# Patient Record
Sex: Male | Born: 1970 | Race: Black or African American | Hispanic: No | Marital: Married | Smoking: Never smoker
Health system: Southern US, Community
[De-identification: ages and names within clinical notes are randomized; demographics above are authoritative.]

## PROBLEM LIST (undated history)

## (undated) DIAGNOSIS — H409 Unspecified glaucoma: Secondary | ICD-10-CM

## (undated) HISTORY — DX: Unspecified glaucoma: H40.9

---

## 2005-08-07 ENCOUNTER — Emergency Department: Payer: Self-pay | Admitting: Internal Medicine

## 2006-09-22 ENCOUNTER — Ambulatory Visit: Payer: Self-pay | Admitting: Unknown Physician Specialty

## 2006-11-11 ENCOUNTER — Ambulatory Visit: Payer: Self-pay | Admitting: Podiatry

## 2009-05-25 ENCOUNTER — Ambulatory Visit: Payer: Self-pay | Admitting: Family Medicine

## 2010-10-07 ENCOUNTER — Ambulatory Visit: Payer: Self-pay | Admitting: Family Medicine

## 2017-10-21 ENCOUNTER — Ambulatory Visit (INDEPENDENT_AMBULATORY_CARE_PROVIDER_SITE_OTHER): Payer: BLUE CROSS/BLUE SHIELD

## 2017-10-21 ENCOUNTER — Other Ambulatory Visit: Payer: Self-pay | Admitting: Podiatry

## 2017-10-21 ENCOUNTER — Ambulatory Visit (INDEPENDENT_AMBULATORY_CARE_PROVIDER_SITE_OTHER): Payer: BLUE CROSS/BLUE SHIELD | Admitting: Podiatry

## 2017-10-21 DIAGNOSIS — M21611 Bunion of right foot: Secondary | ICD-10-CM

## 2017-10-21 DIAGNOSIS — B353 Tinea pedis: Secondary | ICD-10-CM

## 2017-10-21 DIAGNOSIS — R52 Pain, unspecified: Secondary | ICD-10-CM

## 2017-10-21 DIAGNOSIS — M79676 Pain in unspecified toe(s): Secondary | ICD-10-CM

## 2017-10-21 DIAGNOSIS — M2041 Other hammer toe(s) (acquired), right foot: Secondary | ICD-10-CM

## 2017-10-21 DIAGNOSIS — B351 Tinea unguium: Secondary | ICD-10-CM

## 2017-10-21 DIAGNOSIS — M2042 Other hammer toe(s) (acquired), left foot: Secondary | ICD-10-CM

## 2017-10-21 DIAGNOSIS — Z79899 Other long term (current) drug therapy: Secondary | ICD-10-CM

## 2017-10-21 MED ORDER — TERBINAFINE HCL 250 MG PO TABS: 250.0000 mg | ORAL_TABLET | Freq: Every day | ORAL | 0 refills | Status: DC

## 2017-10-21 MED ORDER — CLOTRIMAZOLE-BETAMETHASONE 1-0.05 % EX CREA: 1.0000 "application " | TOPICAL_CREAM | Freq: Two times a day (BID) | CUTANEOUS | 2 refills | Status: DC

## 2017-10-21 NOTE — Progress Notes (Signed)
   Subjective:    Patient ID: Vincent Wright, male    DOB: Dec 19, 1970, 47 y.o.   MRN: [id number]  HPI    Review of Systems  All other systems reviewed and are negative.      Objective:   Physical Exam        Assessment & Plan:

## 2017-10-22 LAB — HEPATIC FUNCTION PANEL
ALK PHOS: 54 IU/L (ref 39–117)
ALT: 24 IU/L (ref 0–44)
AST: 29 IU/L (ref 0–40)
Albumin: 4.2 g/dL (ref 3.5–5.5)
BILIRUBIN, DIRECT: 0.12 mg/dL (ref 0.00–0.40)
Bilirubin Total: 0.4 mg/dL (ref 0.0–1.2)
Total Protein: 6.7 g/dL (ref 6.0–8.5)

## 2017-10-24 NOTE — Progress Notes (Signed)
   Subjective: 47 year old male presenting today as a new patient with a chief complaint of pain to the bilateral second toes and forefoot area that began about one month ago. He reports associated dry skin to bilateral feet as well as discoloration and thickening of the nails of bilaterally. He has not done anything to treat his symptoms. There are no modifying factors noted. He reports h/o left foot surgery for what he believes to be treatment of bone spurs. Patient is here for further evaluation and treatment.   No past medical history on file.    Objective: Physical Exam General: The patient is alert and oriented x3 in no acute distress.  Dermatology: Pruritus to the bilateral feet with hyperkeratosis. Skin is cool, dry and supple bilateral lower extremities. Negative for open lesions or macerations.  Vascular: Palpable pedal pulses bilaterally. No edema or erythema noted. Capillary refill within normal limits.  Neurological: Epicritic and protective threshold grossly intact bilaterally.   Musculoskeletal Exam: Clinical evidence of bunion deformity noted to the respective foot. There is a moderate pain on palpation range of motion of the first MPJ. Lateral deviation of the hallux noted consistent with hallux abductovalgus. Hammertoe contracture also noted on clinical exam to digits 2-4 of the right foot. Symptomatic pain on palpation and range of motion also noted to the metatarsal phalangeal joints of the respective hammertoe digits.   Clinical evidence of Tailor's bunion deformity noted to the respective foot. There is a moderate pain on palpation range of motion of the fifth MPJ.   Radiographic Exam: Increased intermetatarsal angle greater than 15 with a hallux abductus angle greater than 30 noted on AP view. Moderate degenerative changes noted within the first MPJ. Contracture deformity also noted to the interphalangeal joints and MPJs of the digits of the respective  hammertoes.    Assessment: 1. HAV w/ bunion deformity right foot 2. 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 Tinea pedis bilateral  4. Tailor's bunion deformity right    Plan of Care:  1. Patient was evaluated. X-Rays reviewed. 2. Discussed surgery regarding HAV with bunion deformity of the right foot, Tailor's bunion deformity of the right foot and hammertoe repair of digits 2-4 of the right foot. Patient will require 6-8 weeks off from work. He will make arrangements with work and return to the office for surgical consult.  3. Prescription for Lamisil 250 mg #90 provided to patient.  4. Orders for liver function test placed. 5. Return to clinic as needed.   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    Felecia ShellingBrent M. Jesua Tamblyn, DPM [facility]  Dr. Felecia ShellingBrent M. Annielee Jemmott, DPM    [address]                                        [place], [place] [address]                Office [phone]  Fax [phone](336) [phone]

## 2017-12-23 ENCOUNTER — Encounter: Payer: Self-pay | Admitting: Podiatry

## 2017-12-23 ENCOUNTER — Telehealth: Payer: Self-pay | Admitting: *Deleted

## 2017-12-23 ENCOUNTER — Ambulatory Visit (INDEPENDENT_AMBULATORY_CARE_PROVIDER_SITE_OTHER): Payer: BLUE CROSS/BLUE SHIELD | Admitting: Podiatry

## 2017-12-23 DIAGNOSIS — M21611 Bunion of right foot: Secondary | ICD-10-CM | POA: Diagnosis not present

## 2017-12-23 DIAGNOSIS — M2041 Other hammer toe(s) (acquired), right foot: Secondary | ICD-10-CM

## 2017-12-23 NOTE — Patient Instructions (Signed)
Pre-Operative Instructions  Congratulations, you have decided to take an important step towards improving your quality of life.  You can be assured that the doctors and staff at [facility] will be with you every step of the way.  Here are some important things you should know:  1. Plan to be at the surgery center/hospital at least 1 (one) hour prior to your scheduled time, unless otherwise directed by the surgical center/hospital staff.  You must have a responsible adult accompany you, remain during the surgery and drive you home.  Make sure you have directions to the surgical center/hospital to ensure you arrive on time. 2. If you are having surgery at [facility] or [facility], you will need a copy of your medical history and physical form from your family physician within one month prior to the date of surgery. We will give you a form for your primary physician to complete.  3. We make every effort to accommodate the date you request for surgery.  However, there are times where surgery dates or times have to be moved.  We will contact you as soon as possible if a change in schedule is required.   4. No aspirin/ibuprofen for one week before surgery.  If you are on aspirin, any non-steroidal anti-inflammatory medications (Mobic, Aleve, Ibuprofen) should not be taken seven (7) days prior to your surgery.  You make take Tylenol for pain prior to surgery.  5. Medications - If you are taking daily heart and blood pressure medications, seizure, reflux, allergy, asthma, anxiety, pain or diabetes medications, make sure you notify the surgery center/hospital before the day of surgery so they can tell you which medications you should take or avoid the day of surgery. 6. No food or drink after midnight the night before surgery unless directed otherwise by surgical center/hospital staff. 7. No alcoholic beverages 24-hours prior to surgery.  No smoking 24-hours prior or 24-hours after  surgery. 8. Wear loose pants or shorts. They should be loose enough to fit over bandages, boots, and casts. 9. Don't wear slip-on shoes. Sneakers are preferred. 10. Bring your boot with you to the surgery center/hospital.  Also bring crutches or a walker if your physician has prescribed it for you.  If you do not have this equipment, it will be provided for you after surgery. 11. If you have not been contacted by the surgery center/hospital by the day before your surgery, call to confirm the date and time of your surgery. 12. Leave-time from work may vary depending on the type of surgery you have.  Appropriate arrangements should be made prior to surgery with your employer. 13. Prescriptions will be provided immediately following surgery by your doctor.  Fill these as soon as possible after surgery and take the medication as directed. Pain medications will not be refilled on weekends and must be approved by the doctor. 14. Remove nail polish on the operative foot and avoid getting pedicures prior to surgery. 15. Wash the night before surgery.  The night before surgery wash the foot and leg well with water and the antibacterial soap provided. Be sure to pay special attention to beneath the toenails and in between the toes.  Wash for at least three (3) minutes. Rinse thoroughly with water and dry well with a towel.  Perform this wash unless told not to do so by your physician.  Enclosed: 1 Ice pack (please put in freezer the night before surgery)   1 Hibiclens skin cleaner     Pre-op instructions  If you have any questions regarding the instructions, please do not hesitate to call our office.  [place]: [address], [place], [place] [address] -- [phone]  [place]: [address] [place], [place] [address] -- [phone]  [place]: [address]  [place], [place] [address] -- [phone]  [place]: [address], [address], [place], [place] [address] -- [phone]  Website:  [email or web address] 

## 2017-12-23 NOTE — Telephone Encounter (Addendum)
"  I need to schedule my surgery for my foot.  He said he's going to put some pins in my toes to straighten them out."  Do you have a date in mind that you would like to have it done?  "I'd like to do it as soon as possible."  He can do it on May 2nd.  "Okay, that's fine."  I'll get it scheduled.  You can register with the surgical center now, instructions are in the brochure that was given to you.  When you register please let them know how you would like to receive communication; a text, an email or a call.  "Okay, thank you."

## 2017-12-26 NOTE — Progress Notes (Signed)
   Subjective: 47 year old male presenting today for follow up evaluation of right foot pain. He reports continued pain and is ready to discuss surgery. Patient is here for further evaluation and treatment.   No past medical history on file.    Objective: Physical Exam General: The patient is alert and oriented x3 in no acute distress.  Dermatology: Pruritus to the bilateral feet with hyperkeratosis. Skin is cool, dry and supple bilateral lower extremities. Negative for open lesions or macerations.  Vascular: Palpable pedal pulses bilaterally. No edema or erythema noted. Capillary refill within normal limits.  Neurological: Epicritic and protective threshold grossly intact bilaterally.   Musculoskeletal Exam: Clinical evidence of bunion deformity noted to the respective foot. There is a moderate pain on palpation range of motion of the first MPJ. Lateral deviation of the hallux noted consistent with hallux abductovalgus. Hammertoe contracture also noted on clinical exam to digits 2-4 of the right foot. Symptomatic pain on palpation and range of motion also noted to the metatarsal phalangeal joints of the respective hammertoe digits.   Clinical evidence of Tailor's bunion deformity noted to the respective foot. There is a moderate pain on palpation range of motion of the fifth MPJ.    Assessment: 1. HAV w/ bunion deformity right foot 2. 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 Tinea pedis bilateral  4. Tailor's bunion deformity right    Plan of Care:  1. Patient was evaluated.  2. Today we discussed the conservative versus surgical management of the presenting pathology. The patient opts for surgical management. All possible complications and details of the procedure were explained. All patient questions were answered. No guarantees were expressed or implied. 3. Authorization for surgery was initiated today. Surgery will consist of bunionectomy with double osteotomy right, Tailor's  bunionectomy right, PIPJ arthroplasty with MPJ capsulotomy right 2-4.  4. CAM boot dispensed.  5. Continue taking oral Lamisil 250 mg #90. 6. Return to clinic one week post op.   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    Felecia ShellingBrent M. Dolly Harbach, DPM [facility]  Dr. Felecia ShellingBrent M. Filomena Pokorney, DPM    [address]                                        [place], [place] [address]                Office [phone]([phone]  Fax [phone](336) [phone]

## 2018-01-04 DIAGNOSIS — M722 Plantar fascial fibromatosis: Secondary | ICD-10-CM

## 2018-01-12 ENCOUNTER — Encounter: Payer: Self-pay | Admitting: Podiatry

## 2018-01-12 DIAGNOSIS — M7751 Other enthesopathy of right foot: Secondary | ICD-10-CM | POA: Diagnosis not present

## 2018-01-12 DIAGNOSIS — M21541 Acquired clubfoot, right foot: Secondary | ICD-10-CM | POA: Diagnosis not present

## 2018-01-12 DIAGNOSIS — M2011 Hallux valgus (acquired), right foot: Secondary | ICD-10-CM | POA: Diagnosis not present

## 2018-01-12 DIAGNOSIS — M2041 Other hammer toe(s) (acquired), right foot: Secondary | ICD-10-CM | POA: Diagnosis not present

## 2018-01-14 ENCOUNTER — Telehealth: Payer: Self-pay | Admitting: Podiatry

## 2018-01-14 NOTE — Telephone Encounter (Signed)
This  patient calls the office stating he is a patient of Dr. Logan Bores.  Dr. Logan Bores performed multiple procedures on this patient in surgery.  He says the medicine he is taking for pain is not helping.  He says he told Dr. Logan Bores that oxycodone is at ineffective for him.   He says it did not work in his surgery on his shoulder.  E denies any history of chills or fever.  I told him to take acetaminophen and Advil simultaneously.  I also told him to move his Cam Walker, but to ave the surgical bandage on his foot.  If this pain persists he should check with the ER or call the office Monday morning for postop evaluation

## 2018-01-15 NOTE — Telephone Encounter (Signed)
See above

## 2018-01-20 ENCOUNTER — Ambulatory Visit (INDEPENDENT_AMBULATORY_CARE_PROVIDER_SITE_OTHER): Payer: BLUE CROSS/BLUE SHIELD

## 2018-01-20 ENCOUNTER — Ambulatory Visit (INDEPENDENT_AMBULATORY_CARE_PROVIDER_SITE_OTHER): Payer: BLUE CROSS/BLUE SHIELD | Admitting: Podiatry

## 2018-01-20 ENCOUNTER — Other Ambulatory Visit: Payer: Self-pay

## 2018-01-20 VITALS — BP 162/96 | Temp 98.9°F

## 2018-01-20 DIAGNOSIS — M21619 Bunion of unspecified foot: Secondary | ICD-10-CM

## 2018-01-20 DIAGNOSIS — Z9889 Other specified postprocedural states: Secondary | ICD-10-CM

## 2018-01-20 DIAGNOSIS — M2041 Other hammer toe(s) (acquired), right foot: Secondary | ICD-10-CM

## 2018-01-20 MED ORDER — OXYCODONE-ACETAMINOPHEN 5-325 MG PO TABS: 1.0000 | ORAL_TABLET | ORAL | 0 refills | Status: DC | PRN

## 2018-01-23 NOTE — Progress Notes (Signed)
   Subjective:  Patient presents today status post right forefoot reconstruction surgery. DOS: 01/12/18. He states the boot is uncomfortable to wear at night. He reports some drainage from the medial incision site. There are no modifying factors noted. Patient is here for further evaluation and treatment.    No past medical history on file.    Objective/Physical Exam Neurovascular status intact.  Skin incisions appear to be well coapted with sutures and staples intact. No sign of infectious process noted. No dehiscence. No active bleeding noted. Moderate edema noted to the surgical extremity.  Radiographic Exam:  Orthopedic hardware and osteotomies sites appear to be stable with routine healing.  Assessment: 1. s/p right forefoot reconstruction surgery. DOS: 01/12/18   Plan of Care:  1. Patient was evaluated. X-rays reviewed 2. Dressing changed. Keep clean, dry and intact for one week.  3. Continue weightbearing in CAM boot.  4. Return to clinic in one week.    Felecia Shelling, DPM [facility]  Dr. Felecia Shelling, DPM    [address]                                        [place], [place] [address]                Office [phone]  Fax [phone]

## 2018-01-27 ENCOUNTER — Ambulatory Visit (INDEPENDENT_AMBULATORY_CARE_PROVIDER_SITE_OTHER): Payer: BLUE CROSS/BLUE SHIELD | Admitting: Podiatry

## 2018-01-27 ENCOUNTER — Encounter: Payer: Self-pay | Admitting: Podiatry

## 2018-01-27 ENCOUNTER — Encounter: Payer: BLUE CROSS/BLUE SHIELD | Admitting: Podiatry

## 2018-01-27 DIAGNOSIS — M2041 Other hammer toe(s) (acquired), right foot: Secondary | ICD-10-CM

## 2018-01-27 DIAGNOSIS — Z9889 Other specified postprocedural states: Secondary | ICD-10-CM

## 2018-01-27 DIAGNOSIS — M21619 Bunion of unspecified foot: Secondary | ICD-10-CM

## 2018-01-27 MED ORDER — SULFAMETHOXAZOLE-TRIMETHOPRIM 800-160 MG PO TABS: 1.0000 | ORAL_TABLET | Freq: Two times a day (BID) | ORAL | 0 refills | Status: DC

## 2018-01-27 NOTE — Progress Notes (Signed)
   Subjective:  Patient presents today status post right forefoot reconstruction surgery. DOS: 01/12/18. He states his pain has improved although he is still experiencing some aching. There are no modifying factors. He has been wearing the CAM boot as directed. Patient is here for further evaluation and treatment.    No past medical history on file.    Objective/Physical Exam Neurovascular status intact. Skin incisions appear to be well coapted with sutures and staples intact. No sign of infectious process noted. No dehiscence. No active bleeding noted. Serous drainage noted to medial incision site. Moderate edema noted to the surgical extremity.   Assessment: 1. s/p right forefoot reconstruction surgery. DOS: 01/12/18   Plan of Care:  1. Patient was evaluated.  2. Partial staples/sutures removed.  3. Dressing changed. Keep clean, dry and intact for one week.  4. Continue weightbearing in CAM boot.  5. Prescription for Bactrim DS provided to patient.  6. Return to clinic in one week.    Felecia Shelling, DPM [facility]  Dr. Felecia Shelling, DPM    [address]                                        [place], [place] [address]                Office [phone]  Fax [phone]

## 2018-02-03 ENCOUNTER — Encounter: Payer: Self-pay | Admitting: Podiatry

## 2018-02-03 ENCOUNTER — Ambulatory Visit (INDEPENDENT_AMBULATORY_CARE_PROVIDER_SITE_OTHER): Payer: BLUE CROSS/BLUE SHIELD | Admitting: Podiatry

## 2018-02-03 ENCOUNTER — Ambulatory Visit (INDEPENDENT_AMBULATORY_CARE_PROVIDER_SITE_OTHER): Payer: BLUE CROSS/BLUE SHIELD

## 2018-02-03 DIAGNOSIS — M2041 Other hammer toe(s) (acquired), right foot: Secondary | ICD-10-CM

## 2018-02-03 DIAGNOSIS — Z9889 Other specified postprocedural states: Secondary | ICD-10-CM | POA: Diagnosis not present

## 2018-02-03 DIAGNOSIS — L03031 Cellulitis of right toe: Secondary | ICD-10-CM

## 2018-02-03 MED ORDER — SULFAMETHOXAZOLE-TRIMETHOPRIM 800-160 MG PO TABS: 1.0000 | ORAL_TABLET | Freq: Two times a day (BID) | ORAL | 0 refills | Status: DC

## 2018-02-06 LAB — WOUND CULTURE

## 2018-02-10 ENCOUNTER — Encounter: Payer: Self-pay | Admitting: Podiatry

## 2018-02-10 ENCOUNTER — Ambulatory Visit: Payer: BLUE CROSS/BLUE SHIELD

## 2018-02-10 ENCOUNTER — Ambulatory Visit (INDEPENDENT_AMBULATORY_CARE_PROVIDER_SITE_OTHER): Payer: BLUE CROSS/BLUE SHIELD | Admitting: Podiatry

## 2018-02-10 DIAGNOSIS — M2041 Other hammer toe(s) (acquired), right foot: Secondary | ICD-10-CM

## 2018-02-10 DIAGNOSIS — Z9889 Other specified postprocedural states: Secondary | ICD-10-CM

## 2018-02-10 MED ORDER — SULFAMETHOXAZOLE-TRIMETHOPRIM 800-160 MG PO TABS: 1.0000 | ORAL_TABLET | Freq: Two times a day (BID) | ORAL | 0 refills | Status: DC

## 2018-02-13 NOTE — Progress Notes (Signed)
   Subjective:  Patient presents today status post right forefoot reconstruction surgery. DOS: 01/12/18. He states he is doing better. He reports some intermittent sharp, shooting pain at night. There are no modifying factors noted. Patient is here for further evaluation and treatment.   No past medical history on file.    Objective/Physical Exam Neurovascular status intact. Skin incisions appear to be well coapted. No sign of infectious process noted. No dehiscence. No active bleeding noted. Serous drainage noted to medial incision site. Moderate edema noted to the surgical extremity.   Assessment: 1. s/p right forefoot reconstruction surgery. DOS: 01/12/18   Plan of Care:  1. Patient was evaluated.  2. Cultures reviewed.  3. Remaining staples removed.  4. Percutaneous pins removed.  5. Refill prescription for Bactrim DS provided to patient.  6. Post op shoe dispensed.  7. Return to clinic in 2 weeks.     Felecia ShellingBrent M. Cezar Misiaszek, DPM [facility]  Dr. Felecia ShellingBrent M. Phillipe Clemon, DPM    [address]                                        [place], [place] [address]                Office [phone]([phone]  Fax [phone]

## 2018-02-16 NOTE — Progress Notes (Signed)
   Subjective:  Patient presents today status post right forefoot reconstruction surgery. DOS: 01/12/18. He states his pain has improved although he is still experiencing some aching.  There also is some drainage noted to the medial incision site proximally.  He has been weightbearing in the immobilization cam boot and he completed a round of Bactrim DS.  No past medical history on file.    Objective/Physical Exam Neurovascular status intact. Skin incisions appear to be well coapted with the exception of a small area of dehiscence focal the proximal incision site medial foot with some serous drainage noted.  No malodor.  No significant erythema and edema to the soft tissue structures of the foot.  There is some peri-incisional maceration secondary to the drainage. No active bleeding noted. Serous drainage noted to medial incision site. Moderate edema noted to the surgical extremity.   Assessment: 1. s/p right forefoot reconstruction surgery. DOS: 01/12/18   Plan of Care:  1. Patient was evaluated.  2.  Today we are going to refill the patient's Bactrim DS 3.  Cultures were taken of the area of drainage and sent to pathology for culture and sensitivity 4.  Continue weightbearing in the immobilization cam boot 5.  Return to clinic in 1 week   Felecia ShellingBrent M. Christiann Hagerty, DPM [facility]  Dr. Felecia ShellingBrent M. Anyiah Coverdale, DPM    [address]                                        [place], [place] [address]                Office [phone](336) [phone]  Fax [phone](336) [phone]

## 2018-02-28 ENCOUNTER — Telehealth: Payer: Self-pay | Admitting: Podiatry

## 2018-02-28 NOTE — Telephone Encounter (Signed)
I had surgery on 02 May and my foot is really swollen with a lot of pain. I do not have any pain medication or anything for the past week. I was just calling to see what I should do. My number is [phone]. Thank you.

## 2018-02-28 NOTE — Telephone Encounter (Signed)
Unable to leave a message for pt, voicemail box is not set up. Message routed to A. Glynda JaegerVenable, LPN to call pt tomorrow, set up an appt and to discuss the medication with Dr. Logan BoresEvans.

## 2018-03-01 NOTE — Telephone Encounter (Signed)
Called patient and unable to leave message because voice mail has not been set up yet.

## 2018-03-07 ENCOUNTER — Ambulatory Visit (INDEPENDENT_AMBULATORY_CARE_PROVIDER_SITE_OTHER): Payer: BLUE CROSS/BLUE SHIELD

## 2018-03-07 ENCOUNTER — Encounter: Payer: Self-pay | Admitting: Podiatry

## 2018-03-07 ENCOUNTER — Ambulatory Visit (INDEPENDENT_AMBULATORY_CARE_PROVIDER_SITE_OTHER): Payer: BLUE CROSS/BLUE SHIELD | Admitting: Podiatry

## 2018-03-07 DIAGNOSIS — Z9889 Other specified postprocedural states: Secondary | ICD-10-CM

## 2018-03-07 DIAGNOSIS — M2041 Other hammer toe(s) (acquired), right foot: Secondary | ICD-10-CM | POA: Diagnosis not present

## 2018-03-07 DIAGNOSIS — M79604 Pain in right leg: Secondary | ICD-10-CM

## 2018-03-10 ENCOUNTER — Ambulatory Visit
Admission: RE | Admit: 2018-03-10 | Discharge: 2018-03-10 | Disposition: A | Discharge status: Home / Self Care | Payer: BLUE CROSS/BLUE SHIELD | Source: Ambulatory Visit | Attending: Podiatry | Admitting: Podiatry

## 2018-03-10 DIAGNOSIS — Z9889 Other specified postprocedural states: Secondary | ICD-10-CM | POA: Insufficient documentation

## 2018-03-10 DIAGNOSIS — M79604 Pain in right leg: Secondary | ICD-10-CM | POA: Diagnosis not present

## 2018-03-12 NOTE — Progress Notes (Signed)
   Subjective:  Patient presents today status post right forefoot reconstruction surgery. DOS: 01/12/18.  He states that he continues to have pain along the posterior calf of the right lower extremity.  He is also having a lot of pain and swelling in his toes.  He continues to have pain during ambulation as well.  Currently the pain is 7/10.  He is requesting a refill on his pain medication.   No past medical history on file.    Objective/Physical Exam Neurovascular status intact. Skin incisions appear to be well coapted. No sign of infectious process noted. No dehiscence. No active bleeding noted. Serous drainage noted to medial incision site. Moderate edema noted to the surgical extremity.  There is some pain on palpation to the posterior calf possibly consistent with a DVT.   Assessment: 1. s/p right forefoot reconstruction surgery. DOS: 01/12/18 2.  Possible DVT right lower extremity   Plan of Care:  1. Patient was evaluated.  2.  Order for physical therapy initiated 3.  Order for venous Doppler stat to rule out DVT 4.  Discontinue surgical postoperative shoe.  Resume normal good shoe gear.  Weightbearing and ambulation as tolerated 5.  Return to clinic in 3 weeks.   Felecia ShellingBrent M. Alexey Wright, DPM [facility]  Dr. Felecia ShellingBrent M. Jelena Wright, DPM    [address]                                        [place], [place] [address]                Office [phone]) [phone]  Fax [phone](336) [phone]

## 2018-03-28 ENCOUNTER — Encounter: Payer: BLUE CROSS/BLUE SHIELD | Admitting: Podiatry

## 2018-03-30 ENCOUNTER — Ambulatory Visit (INDEPENDENT_AMBULATORY_CARE_PROVIDER_SITE_OTHER): Payer: BLUE CROSS/BLUE SHIELD

## 2018-03-30 ENCOUNTER — Encounter: Payer: Self-pay | Admitting: Podiatry

## 2018-03-30 ENCOUNTER — Ambulatory Visit (INDEPENDENT_AMBULATORY_CARE_PROVIDER_SITE_OTHER): Payer: BLUE CROSS/BLUE SHIELD | Admitting: Podiatry

## 2018-03-30 DIAGNOSIS — M2041 Other hammer toe(s) (acquired), right foot: Secondary | ICD-10-CM

## 2018-03-30 NOTE — Progress Notes (Signed)
Subjective:   Patient ID: Vincent Wright, male   DOB: 47 y.o.   MRN: [id number]   HPI Patient states doing well but he still has trouble with swelling and putting on his regular shoes.  He is approximately 10 weeks after having multiple foot surgery by Dr. Logan BoresEvans   ROS      Objective:  Physical Exam  Neurovascular status intact with patient's right foot healing well wound edges well coapted hallux in rectus position with some limitation of motion of the big toe joint right.  Patient has good position of the lesser digits     Assessment:  Overall doing well with swelling still occurring which is fairly normal for with this extensive surgery     Plan:  H&P condition reviewed and recommended range of motion exercises for the first MPJ gradual return to shoes with hopeful return to work in the next 4 to 6 weeks.  Reappoint for Dr. Logan BoresEvans to reevaluate the next 4 weeks  X-rays indicate fixation is in place with good alignment noted joint congruence

## 2018-04-09 NOTE — Progress Notes (Signed)
This encounter was created in error - please disregard.

## 2018-04-25 ENCOUNTER — Ambulatory Visit (INDEPENDENT_AMBULATORY_CARE_PROVIDER_SITE_OTHER): Payer: BLUE CROSS/BLUE SHIELD | Admitting: Podiatry

## 2018-04-25 ENCOUNTER — Encounter: Payer: Self-pay | Admitting: Podiatry

## 2018-04-25 ENCOUNTER — Ambulatory Visit (INDEPENDENT_AMBULATORY_CARE_PROVIDER_SITE_OTHER): Payer: BLUE CROSS/BLUE SHIELD

## 2018-04-25 DIAGNOSIS — M7751 Other enthesopathy of right foot: Secondary | ICD-10-CM

## 2018-04-25 DIAGNOSIS — M779 Enthesopathy, unspecified: Secondary | ICD-10-CM

## 2018-04-25 DIAGNOSIS — M2041 Other hammer toe(s) (acquired), right foot: Secondary | ICD-10-CM

## 2018-04-25 DIAGNOSIS — Z9889 Other specified postprocedural states: Secondary | ICD-10-CM

## 2018-04-25 MED ORDER — METHYLPREDNISOLONE 4 MG PO TBPK: ORAL_TABLET | ORAL | 0 refills | Status: DC

## 2018-04-25 MED ORDER — MELOXICAM 15 MG PO TABS: 15.0000 mg | ORAL_TABLET | Freq: Every day | ORAL | 1 refills | Status: DC

## 2018-04-27 NOTE — Progress Notes (Signed)
   Subjective:  Patient presents today status post right forefoot reconstruction surgery. DOS: 01/12/18. He states he got an injection in the lateral right leg three months ago and has had a tender nodule since. Touching the area increases the pain. He has not done anything for treatment. Patient is here for further evaluation and treatment.   History reviewed. No pertinent past medical history.    Objective/Physical Exam Neurovascular status intact. Skin incisions appear to be well coapted. No sign of infectious process noted. No dehiscence. No active bleeding noted. Serous drainage noted to medial incision site. Moderate edema noted to the surgical extremity. Swelling and pain with palpation to the lateral right leg, distally, possibly secondary to CAM boot.   Radiographic Exam:  Normal osseous mineralization. Joint spaces preserved. No fracture/dislocation/boney destruction.    Assessment: 1. s/p right forefoot reconstruction surgery. DOS: 01/12/18 2. Right lateral leg contusion    Plan of Care:  1. Patient was evaluated.  2. Continue daily stretching and range of motion exercises.  3. Recommended good shoe gear.  4. Prescription for Medrol Dose Pak provided to patient.  5. Refill prescription for Meloxicam provided to patient.  6. Return to clinic as needed.    Felecia ShellingBrent M. Evans, DPM [facility]  Dr. Felecia ShellingBrent M. Evans, DPM    [address]                                        [place], [place] [address]                Office [phone](336) [phone]  Fax [phone](336) [phone]

## 2018-05-23 ENCOUNTER — Ambulatory Visit (INDEPENDENT_AMBULATORY_CARE_PROVIDER_SITE_OTHER): Payer: BLUE CROSS/BLUE SHIELD

## 2018-05-23 ENCOUNTER — Ambulatory Visit (INDEPENDENT_AMBULATORY_CARE_PROVIDER_SITE_OTHER): Payer: BLUE CROSS/BLUE SHIELD | Admitting: Podiatry

## 2018-05-23 ENCOUNTER — Encounter: Payer: Self-pay | Admitting: Podiatry

## 2018-05-23 DIAGNOSIS — Z9889 Other specified postprocedural states: Secondary | ICD-10-CM | POA: Diagnosis not present

## 2018-05-23 DIAGNOSIS — M2041 Other hammer toe(s) (acquired), right foot: Secondary | ICD-10-CM

## 2018-05-30 NOTE — Progress Notes (Signed)
   Subjective:  Patient presents today status post right forefoot reconstructive surgery.  Date of surgery 01/12/2018.  Patient states that he continues to feel pain 4/10 to his surgical forefoot.  He also complains of some numbness and also pain specifically when taking his shoes off.  Patient has no new complaints today.   History reviewed. No pertinent past medical history.    Objective/Physical Exam Neurovascular status intact. Skin incisions appear to be healed. No sign of infectious process noted. Negative for edema with pedal pulses palpable and capillary refill immediate. No open lesions. There is some limited range of motion noted to the metatarsophalangeal joints of the surgical forefoot likely due to scar tissue.  There is also some pain on range of motion as well as palpation to the MTPJ's 1 through 5 right foot.  Radiographic Exam:  Normal osseous mineralization. Joint spaces preserved. No fracture/dislocation/boney destruction.  Complete healing of the osteotomy sites  Assessment: 1. s/p right forefoot reconstruction surgery. DOS: 01/12/18  Plan of Care:  1. Patient was evaluated today.  X-rays were reviewed today 2.  Patient continues to have some residual foot pain and joint capsulitis secondary to postoperative recovery. 3.  Today we are going to prescribe Medrol Dosepak 4.  Extend physical therapy 3 times per week x4 weeks.  Patient states that he is getting significant benefit from physical therapy and notices improvement. 5.  Today we are going to extend the patient's disability to 06/22/2018.  After that the patient should be able to return to work full duty no restrictions. 6.  Return to clinic in 1 month  Felecia ShellingBrent M. Arham Symmonds, DPM [facility]  Dr. Felecia ShellingBrent M. Ka Flammer, DPM    [address]                                        [place], [place] [address]                Office [phone](336) [phone]  Fax [phone](336) [phone]

## 2018-06-20 ENCOUNTER — Ambulatory Visit (INDEPENDENT_AMBULATORY_CARE_PROVIDER_SITE_OTHER): Payer: BLUE CROSS/BLUE SHIELD | Admitting: Podiatry

## 2018-06-20 ENCOUNTER — Encounter: Payer: Self-pay | Admitting: Podiatry

## 2018-06-20 DIAGNOSIS — M2041 Other hammer toe(s) (acquired), right foot: Secondary | ICD-10-CM

## 2018-06-20 DIAGNOSIS — M21611 Bunion of right foot: Secondary | ICD-10-CM | POA: Diagnosis not present

## 2018-06-20 NOTE — Progress Notes (Signed)
   Subjective:  Patient presents today status post right forefoot reconstructive surgery.  Date of surgery 01/12/2018.  Patient continues to have some numbness to the plantar aspect of the surgical forefoot.  Denies any significant pain.  He presents for follow-up treatment evaluation   History reviewed. No pertinent past medical history.    Objective/Physical Exam Neurovascular status intact. Skin incisions appear to be healed. No sign of infectious process noted. Negative for edema with pedal pulses palpable and capillary refill immediate. No open lesions. There is some limited range of motion noted to the metatarsophalangeal joints of the surgical forefoot likely due to scar tissue.  Negative for any significant pain on palpation.  Assessment: 1. s/p right forefoot reconstruction surgery. DOS: 01/12/18  Plan of Care:  1. Patient was evaluated today.   2.  Patient is completed physical therapy.  Patient is ready to return to work.  Return to work full duty no restrictions beginning 06/26/2018. 3.  Recommend good supportive shoe gear  Felecia Shelling, DPM [facility]  Dr. Felecia Shelling, DPM    [address]                                        [place], [place] [address]                Office [phone]  Fax [phone]

## 2019-02-23 IMAGING — US US EXTREM LOW VENOUS*R*
1 series · 14 of 24 positions shown · non-contrast
Comparison: None

CLINICAL DATA: Pain x1 month.  Recent foot surgery.

EXAM:
RIGHT LOWER EXTREMITY VENOUS DOPPLER ULTRASOUND
TECHNIQUE: Gray-scale sonography with compression, as well as color and duplex
ultrasound, were performed to evaluate the deep venous system from
the level of the common femoral vein through the popliteal and
proximal calf veins.

[Series 1: us extrem low venous*right* · 0.07mm/px · 14 of 44 slices shown]
[im 1/44]
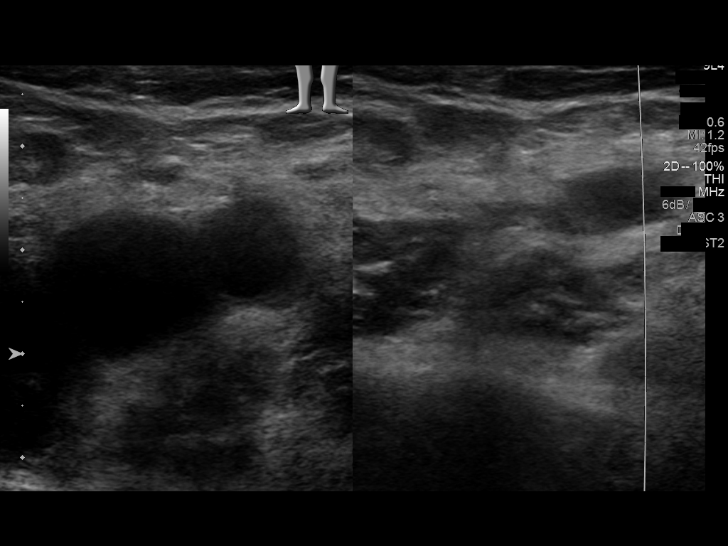
[im 4/44]
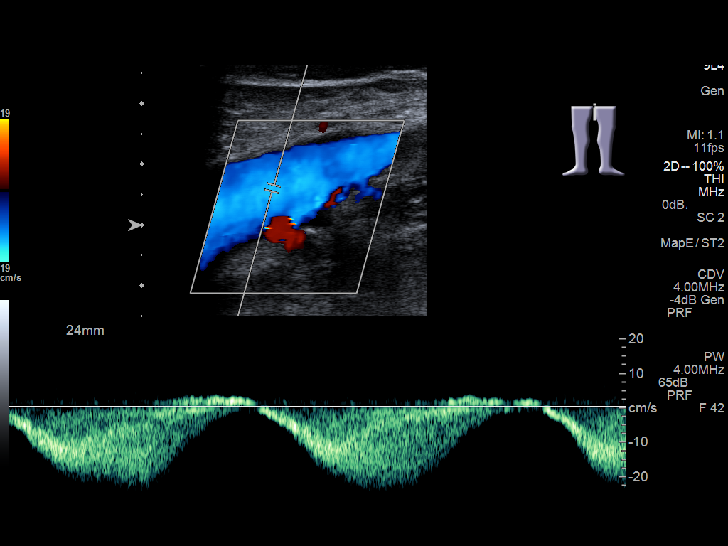
[im 8/44]
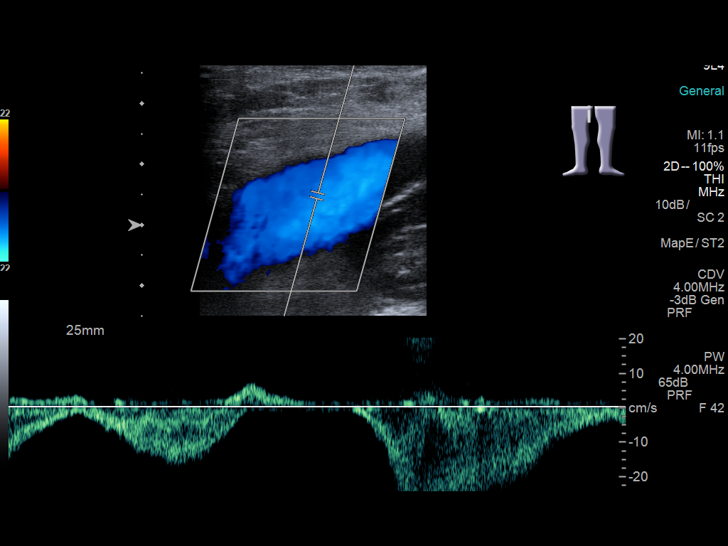
[im 12/44]
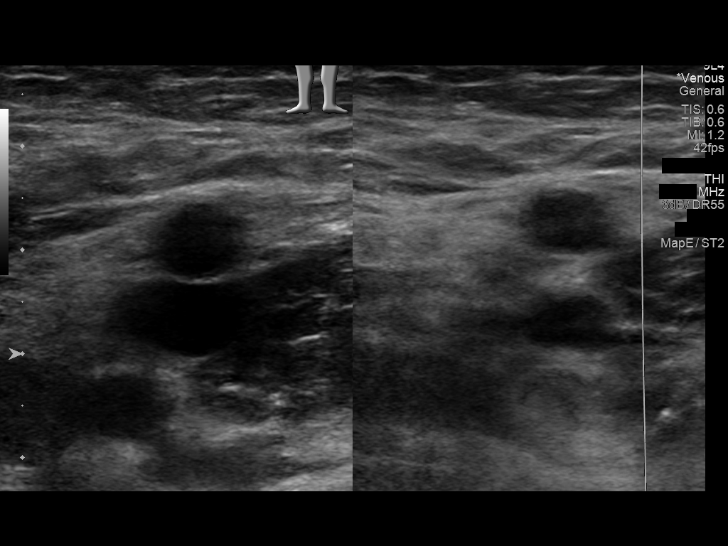
[im 14/44]
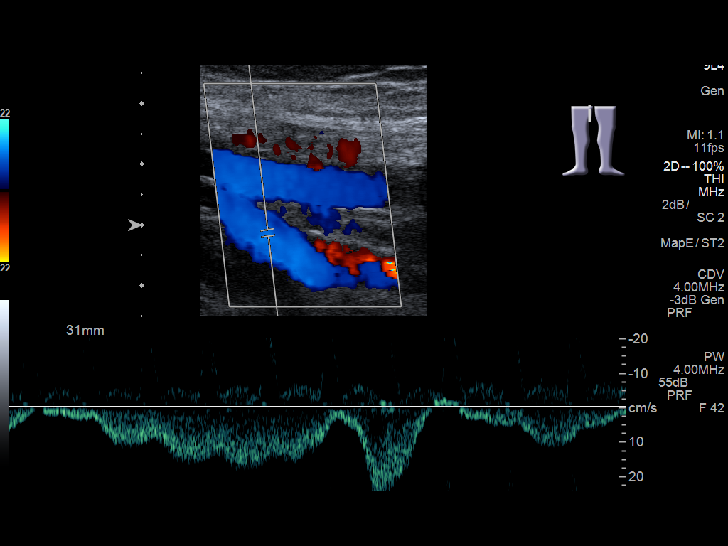
[im 17/44]
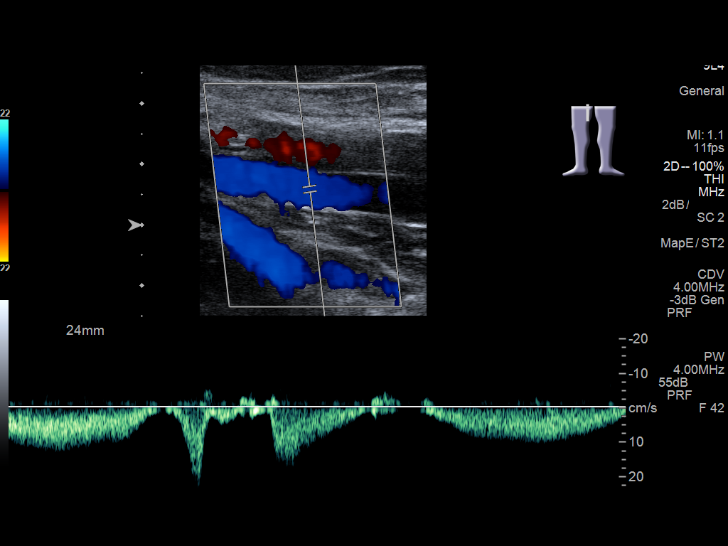
[im 21/44]
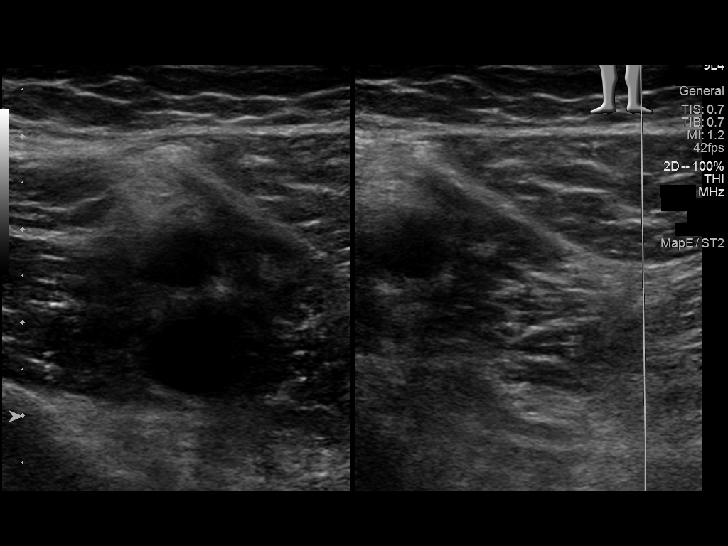
[im 23/44]
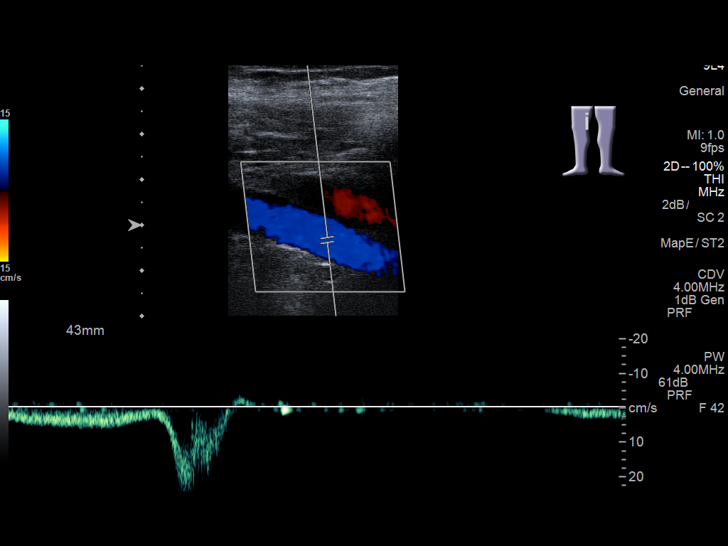
[im 27/44]
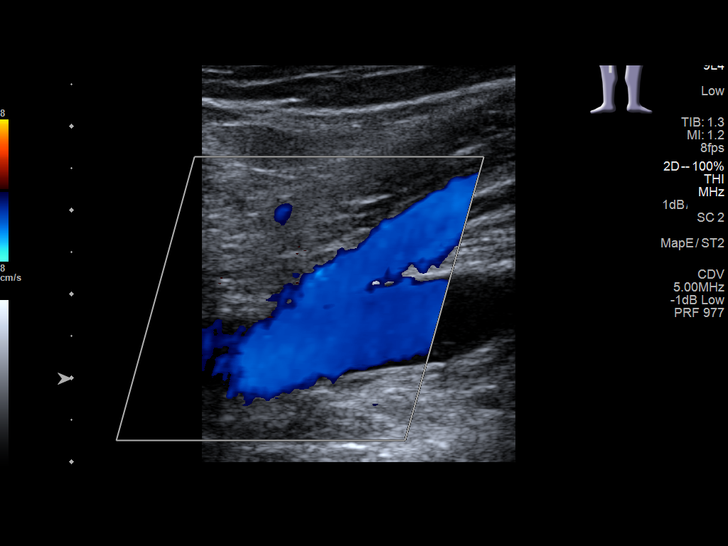
[im 30/44]
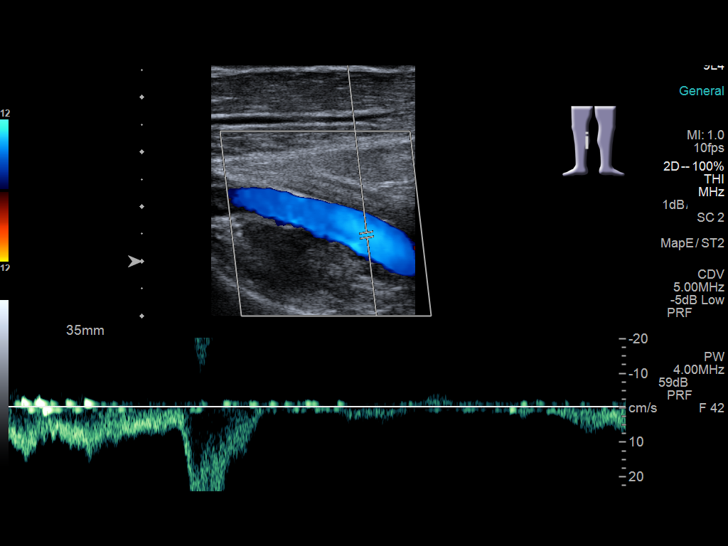
[im 34/44]
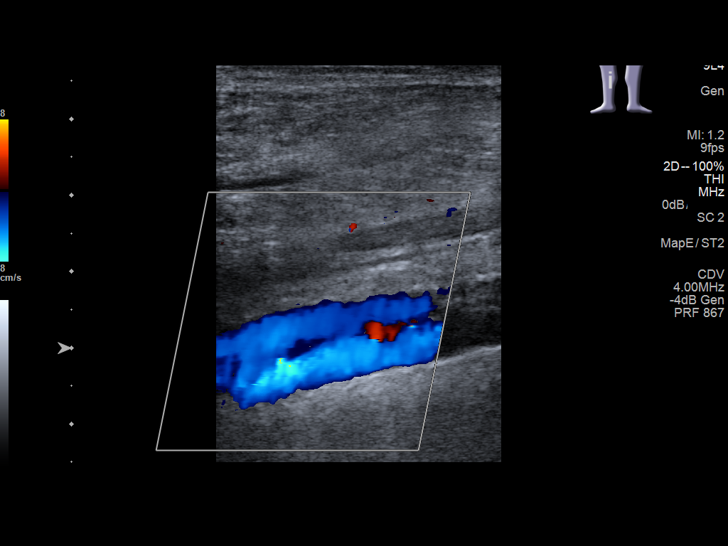
[im 36/44]
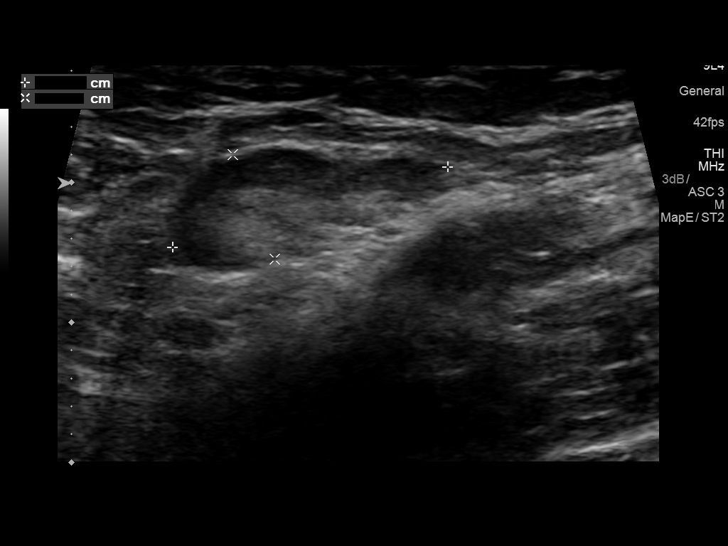
[im 40/44]
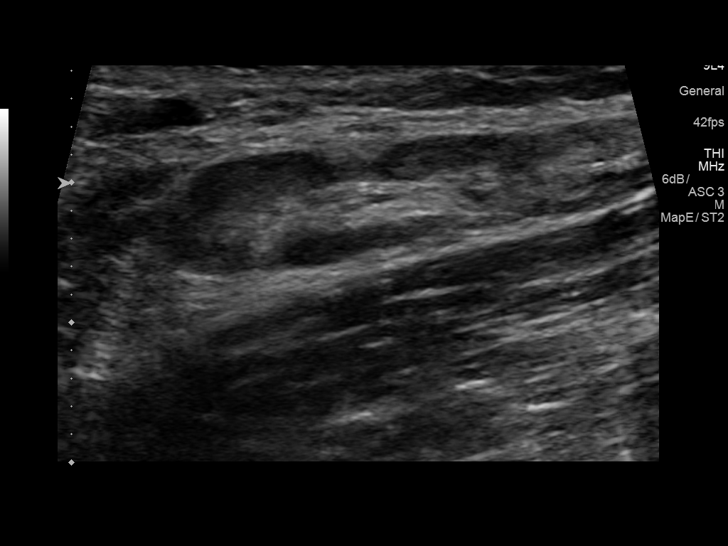
[im 44/44]
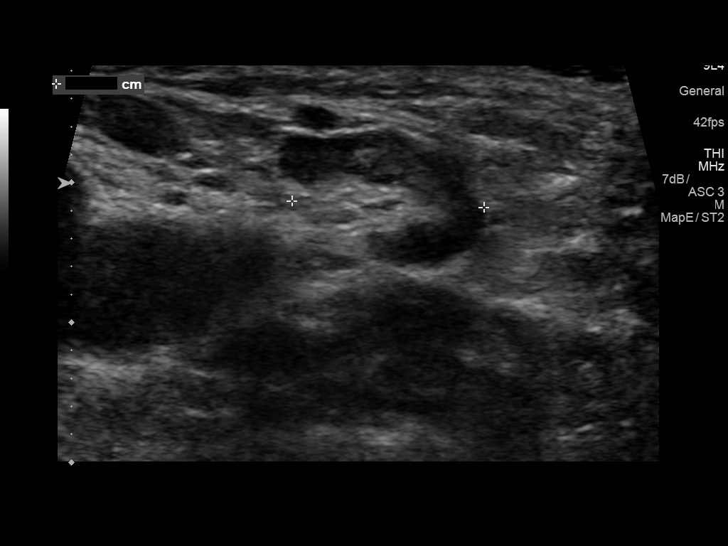

[14 of 24 positions shown; findings below may reference images not displayed]

FINDINGS: Normal compressibility of the common femoral, superficial femoral,
and popliteal veins, as well as the proximal calf veins. No filling
defects to suggest DVT on grayscale or color Doppler imaging.
Doppler waveforms show normal direction of venous flow, normal
respiratory phasicity and response to augmentation. Survey views of
the contralateral common femoral vein are unremarkable.
IMPRESSION: No evidence of right lower extremity deep vein thrombosis.

## 2023-10-27 ENCOUNTER — Encounter: Payer: Self-pay | Admitting: Podiatry

## 2023-10-27 ENCOUNTER — Ambulatory Visit: Payer: BC Managed Care – PPO | Admitting: Podiatry

## 2023-10-27 DIAGNOSIS — B351 Tinea unguium: Secondary | ICD-10-CM | POA: Diagnosis not present

## 2023-10-27 DIAGNOSIS — G6289 Other specified polyneuropathies: Secondary | ICD-10-CM

## 2023-10-27 DIAGNOSIS — L84 Corns and callosities: Secondary | ICD-10-CM

## 2023-10-27 DIAGNOSIS — M79674 Pain in right toe(s): Secondary | ICD-10-CM | POA: Diagnosis not present

## 2023-10-27 MED ORDER — GABAPENTIN 100 MG PO CAPS: 100.0000 mg | ORAL_CAPSULE | Freq: Three times a day (TID) | ORAL | 3 refills | Status: DC

## 2023-10-27 NOTE — Patient Instructions (Signed)
More silicone and felt pads can be purchased from:  [email or web address]  Horseshoe felt pad and toe spacers were dispensed to you today.  You are also findings on [employer]  Look for urea 40% cream or ointment and apply to the thickened dry skin / calluses. This can be bought over the counter, at a pharmacy or online such as [employer].  You can also look for combination product with salicylic acid.  May scrub these areas with white vinegar.

## 2023-10-27 NOTE — Progress Notes (Signed)
Chief Complaint  Patient presents with   Nail Problem    He has the big toe and 3rd toe that he has trouble trimming and a callous on the bottom near the 4th toe that is sore.    HPI: 53 y.o. male presents today for right foot painful calluses that he is difficulty maintaining himself.  Patient is nondiabetic.  He reports history of right foot surgery with Dr. Logan Bores several years prior.  He does report numbness in the right foot starting at the level of the ankle.  Also presents with painful, dystrophic toenails that the patient is unable to maintain himself.  History reviewed. No pertinent past medical history.  History reviewed. No pertinent surgical history.  No Known Allergies  ROS denies any nausea, vomiting, fever, chills, chest pain, shortness of breath   Physical Exam: There were no vitals filed for this visit.  General: The patient is alert and oriented x3 in no acute distress.  Dermatology: Painful preulcerative calluses present right fifth toe dorsal medial aspect at the level of the head of the proximal phalanx, subfifth metatarsal head, subthird metatarsal head and plantar medial head of the first toe proximal phalanx right foot.  Right foot toenails are painful, dystrophic, increased length and thickness noted with darkened discoloration suggestive of fungal infection.  They are painful on direct dorsal palpation.  Vascular: Palpable pedal pulses bilaterally. Capillary refill within normal limits.  No appreciable edema.  No erythema or calor.  Neurological: Light touch sensation diminished right foot.  Protective sensation present at 5/10 sites right foot.  Musculoskeletal Exam: Adductovarus 5th toe noted right foot.  Muscle strength 5/5 for all major muscle groups.  Radiographic Exam:  Deferred today.  Assessment/Plan of Care: 1. Other polyneuropathy   2. Pre-ulcerative calluses   3. Pain due to onychomycosis of toenail of right foot      Meds ordered  this encounter  Medications   gabapentin (NEURONTIN) 100 MG capsule    Sig: Take 1 capsule (100 mg total) by mouth 3 (three) times daily.    Dispense:  90 capsule    Refill:  3   None  Discussed clinical findings with patient today.  # Preulcerative callus right foot subfifth met head, subthird met head, head of first toe proximal phalanx plantar medial aspect, head of fifth toe proximal phalanx lateral aspect -All symptomatic hyperkeratoses  x4 were safely debrided with a sterile #312 blade to patient's level of comfort without incident. We discussed preventative and palliative care of these lesions including supportive and accommodative shoegear, padding, prefabricated and custom molded accommodative orthoses, use of a pumice stone and lotions/creams daily.  -ABN on file -Dancer's pad dispensed  # Right foot peripheral neuropathy -Starting patient on gabapentin 100 mg 3 times daily. -Follow-up in approximately 4 months to see how he is tolerating the medication -Discussed common etiologies of nueropathy. Possible this could be due to tourniquet use from foot surgery, however this was over 5 years ago.  # Painful onychomycosis right foot -Nails 1 through 3 most painful -Debrided in thickness and length using sterile nail nippers without incident   Raven Furnas L. Marchia Bond, AACFAS [facility]     [address]                                        [place], [place]  [address]                Office [phone]  Fax [phone]

## 2023-11-25 ENCOUNTER — Ambulatory Visit: Payer: BC Managed Care – PPO | Admitting: Podiatry

## 2023-12-22 ENCOUNTER — Ambulatory Visit: Admitting: Podiatry

## 2024-06-01 ENCOUNTER — Emergency Department (HOSPITAL_COMMUNITY)

## 2024-06-01 ENCOUNTER — Emergency Department (HOSPITAL_COMMUNITY)
Admission: EM | Admit: 2024-06-01 | Discharge: 2024-06-01 | Disposition: A | Discharge status: Home / Self Care | Attending: Emergency Medicine | Admitting: Emergency Medicine

## 2024-06-01 ENCOUNTER — Other Ambulatory Visit: Payer: Self-pay

## 2024-06-01 ENCOUNTER — Encounter (HOSPITAL_COMMUNITY): Payer: Self-pay | Admitting: Emergency Medicine

## 2024-06-01 DIAGNOSIS — M5442 Lumbago with sciatica, left side: Secondary | ICD-10-CM

## 2024-06-01 DIAGNOSIS — M545 Low back pain, unspecified: Secondary | ICD-10-CM | POA: Insufficient documentation

## 2024-06-01 LAB — CBC WITH DIFFERENTIAL/PLATELET
Abs Immature Granulocytes: 0.02 K/uL (ref 0.00–0.07)
Basophils Absolute: 0 K/uL (ref 0.0–0.1)
Basophils Relative: 0 %
Eosinophils Absolute: 0.1 K/uL (ref 0.0–0.5)
Eosinophils Relative: 1 %
HCT: 41.1 % (ref 39.0–52.0)
Hemoglobin: 13.7 g/dL (ref 13.0–17.0)
Immature Granulocytes: 0 %
Lymphocytes Relative: 14 %
Lymphs Abs: 1.1 K/uL (ref 0.7–4.0)
MCH: 30.1 pg (ref 26.0–34.0)
MCHC: 33.3 g/dL (ref 30.0–36.0)
MCV: 90.3 fL (ref 80.0–100.0)
Monocytes Absolute: 0.9 K/uL (ref 0.1–1.0)
Monocytes Relative: 11 %
Neutro Abs: 6 K/uL (ref 1.7–7.7)
Neutrophils Relative %: 74 %
Platelets: 276 K/uL (ref 150–400)
RBC: 4.55 MIL/uL (ref 4.22–5.81)
RDW: 15.2 % (ref 11.5–15.5)
WBC: 8.1 K/uL (ref 4.0–10.5)
nRBC: 0 % (ref 0.0–0.2)

## 2024-06-01 LAB — URINALYSIS, ROUTINE W REFLEX MICROSCOPIC
Bacteria, UA: NONE SEEN
Bilirubin Urine: NEGATIVE
Glucose, UA: NEGATIVE mg/dL
Hgb urine dipstick: NEGATIVE
Ketones, ur: 20 mg/dL — AB
Leukocytes,Ua: NEGATIVE
Nitrite: NEGATIVE
Protein, ur: 30 mg/dL — AB
Specific Gravity, Urine: 1.018 (ref 1.005–1.030)
pH: 6 (ref 5.0–8.0)

## 2024-06-01 LAB — COMPREHENSIVE METABOLIC PANEL WITH GFR
ALT: 21 U/L (ref 0–44)
AST: 32 U/L (ref 15–41)
Albumin: 4.2 g/dL (ref 3.5–5.0)
Alkaline Phosphatase: 51 U/L (ref 38–126)
Anion gap: 13 (ref 5–15)
BUN: 15 mg/dL (ref 6–20)
CO2: 25 mmol/L (ref 22–32)
Calcium: 9.1 mg/dL (ref 8.9–10.3)
Chloride: 101 mmol/L (ref 98–111)
Creatinine, Ser: 1.35 mg/dL — ABNORMAL HIGH (ref 0.61–1.24)
GFR, Estimated: >60 mL/min (ref >60)
Glucose, Bld: 129 mg/dL — ABNORMAL HIGH (ref 70–99)
Potassium: 4.3 mmol/L (ref 3.5–5.1)
Sodium: 139 mmol/L (ref 135–145)
Total Bilirubin: 1.1 mg/dL (ref 0.0–1.2)
Total Protein: 7.4 g/dL (ref 6.5–8.1)

## 2024-06-01 LAB — CK: Total CK: 356 U/L (ref 49–397)

## 2024-06-01 MED ORDER — KETOROLAC TROMETHAMINE 15 MG/ML IJ SOLN
15.0000 mg | Freq: Once | INTRAMUSCULAR | Status: AC
  Administered 2024-06-01: 15 mg via INTRAVENOUS
  Filled 2024-06-01: qty 1

## 2024-06-01 MED ORDER — SODIUM CHLORIDE 0.9 % IV BOLUS
1000.0000 mL | Freq: Once | INTRAVENOUS | Status: AC
  Administered 2024-06-01: 1000 mL via INTRAVENOUS

## 2024-06-01 MED ORDER — DEXAMETHASONE SODIUM PHOSPHATE 10 MG/ML IJ SOLN
10.0000 mg | Freq: Once | INTRAMUSCULAR | Status: AC
  Administered 2024-06-01: 10 mg via INTRAVENOUS
  Filled 2024-06-01: qty 1

## 2024-06-01 MED ORDER — HYDROMORPHONE HCL 1 MG/ML IJ SOLN
1.0000 mg | Freq: Once | INTRAMUSCULAR | Status: AC
  Administered 2024-06-01: 1 mg via INTRAVENOUS
  Filled 2024-06-01: qty 1

## 2024-06-01 MED ORDER — METHOCARBAMOL 750 MG PO TABS: 750.0000 mg | ORAL_TABLET | Freq: Three times a day (TID) | ORAL | 0 refills | Status: DC

## 2024-06-01 MED ORDER — PREDNISONE 10 MG PO TABS: 40.0000 mg | ORAL_TABLET | Freq: Every day | ORAL | 0 refills | Status: AC

## 2024-06-01 NOTE — ED Notes (Signed)
 Pt/family received d/c paperwork at this time. After going over the paperwork any questions, comments, or concerns were answered to the best of this nurse's knowledge. The pt/family verbally acknowledged the teachings/instructions.

## 2024-06-01 NOTE — ED Provider Notes (Signed)
 [place] EMERGENCY DEPARTMENT AT [facility] Provider Note   CSN: [id number] Arrival date & time: 06/01/24  0940     Patient presents with: Back Pain   Vincent Wright is a 53 y.o. male.   Patient is a 53 year old male who presents emergency department the chief complaint of mid and left lower back pain which has been ongoing for approximate the past 2 days.  He denies any recent falls or blunt trauma.  He notes that the pain does radiate down his left leg.  He denies any associated urinary bowel incontinence, saddle paresthesias, gait changes, fever, chills, history of IV drug use, history HIV, history of cancer, history chronic steroid use.  He denies any dysuria or hematuria.  He has had no abdominal pain, nausea, vomiting, diarrhea.   Back Pain      Prior to Admission medications   Medication Sig Start Date End Date Taking? Authorizing Provider  clotrimazole -betamethasone  (LOTRISONE ) cream Apply 1 application topically 2 (two) times daily. 10/21/17   Janit Thresa HERO, DPM  gabapentin  (NEURONTIN ) 100 MG capsule Take 1 capsule (100 mg total) by mouth 3 (three) times daily. 10/27/23   Lamount Ethan CROME, DPM  terbinafine  (LAMISIL ) 250 MG tablet Take 1 tablet (250 mg total) by mouth daily. 10/21/17   Janit Thresa HERO, DPM    Allergies: Patient has no known allergies.    Review of Systems  Musculoskeletal:  Positive for back pain.  All other systems reviewed and are negative.   Updated Vital Signs BP (!) 133/91   Pulse 61   Temp (!) 97.5 F (36.4 C) (Oral)   Resp 18   Ht 6' 5 (1.956 m)   Wt 89.4 kg   SpO2 94%   BMI 23.36 kg/m   Physical Exam Vitals and nursing note reviewed.  Constitutional:      General: He is not in acute distress.    Appearance: Normal appearance. He is not ill-appearing.  HENT:     Head: Normocephalic and atraumatic.     Nose: Nose normal.     Mouth/Throat:     Mouth: Mucous membranes are moist.  Eyes:     Extraocular Movements: Extraocular  movements intact.     Conjunctiva/sclera: Conjunctivae normal.     Pupils: Pupils are equal, round, and reactive to light.  Cardiovascular:     Rate and Rhythm: Normal rate and regular rhythm.     Pulses: Normal pulses.     Heart sounds: Normal heart sounds. No murmur heard.    No gallop.  Pulmonary:     Effort: Pulmonary effort is normal. No respiratory distress.     Breath sounds: Normal breath sounds. No stridor. No wheezing, rhonchi or rales.  Abdominal:     General: Abdomen is flat. Bowel sounds are normal. There is no distension.     Palpations: Abdomen is soft.     Tenderness: There is no abdominal tenderness. There is no guarding.  Musculoskeletal:        General: Normal range of motion.     Cervical back: Normal range of motion and neck supple. No rigidity or tenderness.     Comments: Tender to palpation noted over lumbar spine and paraspinous muscles, no CVA tenderness, no step-off or deformity, no overlying erythema or warmth, nontender palpation of the bilateral lower extremities, DP and PT pulses are 2+ distally bilateral lower extremities  Skin:    General: Skin is warm and dry.     Findings: No bruising  or rash.  Neurological:     General: No focal deficit present.     Mental Status: He is alert and oriented to person, place, and time. Mental status is at baseline.     Cranial Nerves: No cranial nerve deficit.     Sensory: No sensory deficit.     Motor: No weakness.     Coordination: Coordination normal.     Gait: Gait normal.     Comments: Extension of bilateral great toes intact, extension and flexion at hips intact  Psychiatric:        Mood and Affect: Mood normal.        Behavior: Behavior normal.        Thought Content: Thought content normal.        Judgment: Judgment normal.     (all labs ordered are listed, but only abnormal results are displayed) Labs Reviewed  COMPREHENSIVE METABOLIC PANEL WITH GFR - Abnormal; Notable for the following components:       Result Value   Glucose, Bld 129 (*)    Creatinine, Ser 1.35 (*)    All other components within normal limits  URINALYSIS, ROUTINE W REFLEX MICROSCOPIC - Abnormal; Notable for the following components:   Ketones, ur 20 (*)    Protein, ur 30 (*)    All other components within normal limits  CBC WITH DIFFERENTIAL/PLATELET  CK    EKG: None  Radiology: CT Renal Stone Study Result Date: 06/01/2024 CLINICAL DATA:  Acute left flank pain. EXAM: CT ABDOMEN AND PELVIS WITHOUT CONTRAST TECHNIQUE: Multidetector CT imaging of the abdomen and pelvis was performed following the standard protocol without IV contrast. RADIATION DOSE REDUCTION: This exam was performed according to the departmental dose-optimization program which includes automated exposure control, adjustment of the mA and/or kV according to patient size and/or use of iterative reconstruction technique. COMPARISON:  None Available. FINDINGS: Lower chest: No acute abnormality. Hepatobiliary: No focal liver abnormality is seen. No gallstones, gallbladder wall thickening, or biliary dilatation. Pancreas: Unremarkable. No pancreatic ductal dilatation or surrounding inflammatory changes. Spleen: Normal in size without focal abnormality. Adrenals/Urinary Tract: Adrenal glands are unremarkable. Kidneys are normal, without renal calculi, focal lesion, or hydronephrosis. Bladder is unremarkable. Stomach/Bowel: Stomach is within normal limits. Appendix appears normal. No evidence of bowel wall thickening, distention, or inflammatory changes. Vascular/Lymphatic: No significant vascular findings are present. No enlarged abdominal or pelvic lymph nodes. Reproductive: Prostate is unremarkable. Other: No abdominal wall hernia or abnormality. No abdominopelvic ascites. Musculoskeletal: No acute or significant osseous findings. IMPRESSION: No definite abnormality seen in the abdomen or pelvis. Electronically Signed   By: Lynwood Landy Raddle M.D.   On: 06/01/2024 12:54      Procedures   Medications Ordered in the ED  HYDROmorphone  (DILAUDID ) injection 1 mg (1 mg Intravenous Given 06/01/24 1119)  ketorolac  (TORADOL ) 15 MG/ML injection 15 mg (15 mg Intravenous Given 06/01/24 1118)  dexamethasone  (DECADRON ) injection 10 mg (10 mg Intravenous Given 06/01/24 1118)  sodium chloride  0.9 % bolus 1,000 mL (1,000 mLs Intravenous New Bag/Given 06/01/24 1217)                                    Medical Decision Making Patient is doing well at this time and is stable for discharge home.  Patient's pain is greatly improved with treatment in the emergency department.  Discussed with patient that blood work, urinalysis and CT scan were overall unremarkable.  Low suspicion for cauda equina syndrome, vertebral osteomyelitis, epidural abscess and do not suspect an emergent MRI is warranted.  Patient is TUNAFISH negative and has no concerning neurological deficits.  Patient has no tenderness along the abdomen or pelvis.  Suspect a lumbar radiculopathy at this time.  Will continue symptomatic treatment on an outpatient basis.  Close follow-up with PCP was discussed as well as strict turn precautions for any new or worsening symptoms.  Patient voiced understanding to the plan and had no additional questions.  Amount and/or Complexity of Data Reviewed Labs: ordered. Radiology: ordered.  Risk Prescription drug management.        Final diagnoses:  None    ED Discharge Orders     None          Daralene Lonni JONETTA DEVONNA 06/01/24 1416    Suzette Pac, MD 06/06/24 1012

## 2024-06-01 NOTE — Discharge Instructions (Signed)

## 2024-06-01 NOTE — ED Triage Notes (Signed)
 Pt bib pov w/ c/o mid to lower down the left leg pain. Pain is constant and is rated 10/10. Pt denies any unusual activity or injury.

## 2024-06-04 ENCOUNTER — Ambulatory Visit: Payer: Self-pay

## 2024-06-04 NOTE — Telephone Encounter (Signed)
 FYI Only or Action Required?: FYI only for provider.  Patient was last seen in primary care on na.  Called Nurse Triage reporting Back Pain.  Symptoms began several days ago.  Interventions attempted: Prescription medications: currently on prednisone .  Symptoms are: unchanged.  Triage Disposition: See PCP Within 2 Weeks  Patient/caregiver understands and will follow disposition?: Yes   Copied from CRM [id number]. Topic: Clinical - Red Word Triage >> Jun 04, 2024 12:46 PM Wess RAMAN wrote: Red Word that prompted transfer to Nurse Triage: Vincent Wright pain shooting to back due to Sciatica. Needs hospital follow up/ new patient appt. Pain goes away with medication given by ER Reason for Disposition  Back pain present > 2 weeks  Answer Assessment - Initial Assessment Questions 1. ONSET: When did the pain begin? (e.g., minutes, hours, days)     Friday 2. LOCATION: Where does it hurt? (upper, mid or lower back)     Lower back down left leg 3. SEVERITY: How bad is the pain?  (e.g., Scale 1-10; mild, moderate, or severe)     moderate 4. PATTERN: Is the pain constant? (e.g., yes, no; constant, intermittent)      constant 5. RADIATION: Does the pain shoot into your legs or somewhere else?     Left leg 6. CAUSE:  What do you think is causing the back pain?      Sciatic  7. BACK OVERUSE:  Any recent lifting of heavy objects, strenuous work or exercise?     denies 8. MEDICINES: What have you taken so far for the pain? (e.g., nothing, acetaminophen , NSAIDS)     Steroids  9. NEUROLOGIC SYMPTOMS: Do you have any weakness, numbness, or problems with bowel/bladder control?     denies 10. OTHER SYMPTOMS: Do you have any other symptoms? (e.g., fever, abdomen pain, burning with urination, blood in urine)       denies 11. PREGNANCY: Is there any chance you are pregnant? When was your last menstrual period?       na  Protocols used: Back Pain-A-AH

## 2024-06-10 NOTE — Progress Notes (Signed)
 "  New Patient Visit  Subjective:     Patient ID: Vincent Wright, male    DOB: [birth year], 53 y.o.   MRN: [id number]  Chief Complaint  Patient presents with   Establish Care    HPI  Discussed the use of AI scribe software for clinical note transcription with the patient, who gave verbal consent to proceed.  History of Present Illness Vincent Wright is a 53 year old male who presents with severe back pain radiating to the foot.  Lumbosacral radicular pain - Severe back pain radiating to the foot for over one week - Pain described as persistent pressure - Exacerbation of pain following a sudden jump at work - Temporary relief with gabapentin , prednisone , and Toradol  - Gabapentin  provided brief relief; now depleted - Methocarbamol  (muscle relaxer) did not induce sleepiness; now depleted - Aleve, Tylenol , and ibuprofen ineffective for pain control - Heat and ice provide temporary relief - No exercises prescribed due to back tightness  Sleep disturbance - Significant sleep disruption due to pain - Able to sleep only two hours per night  Medication tolerance and allergies - No medication allergies reported  Blood pressure concerns - Attributes any elevated blood pressure to pain and worry     ROS Per HPI  Outpatient Encounter Medications as of 06/11/2024  Medication Sig   cyclobenzaprine  (FLEXERIL ) 10 MG tablet Take 1 tablet (10 mg total) by mouth 3 (three) times daily as needed for muscle spasms.   gabapentin  (NEURONTIN ) 100 MG capsule Take 1 capsule (100 mg total) by mouth 3 (three) times daily.   HYDROcodone -acetaminophen  (NORCO/VICODIN) 5-325 MG tablet Take 1-2 tablets by mouth every 4 (four) hours as needed for up to 5 days for severe pain (pain score 7-10) or moderate pain (pain score 4-6).   meloxicam  (MOBIC ) 15 MG tablet Take 1 tablet (15 mg total) by mouth daily.   methocarbamol  (ROBAXIN ) 750 MG tablet Take 1 tablet (750 mg total) by mouth 3 (three) times daily.    clotrimazole -betamethasone  (LOTRISONE ) cream Apply 1 application topically 2 (two) times daily. (Patient not taking: Reported on 06/11/2024)   terbinafine  (LAMISIL ) 250 MG tablet Take 1 tablet (250 mg total) by mouth daily. (Patient not taking: Reported on 06/11/2024)   [EXPIRED] ketorolac  (TORADOL ) injection 30 mg    No facility-administered encounter medications on file as of 06/11/2024.    Past Medical History:  Diagnosis Date   Glaucoma 10/2017    History reviewed. No pertinent surgical history.  History reviewed. No pertinent family history.  Social History   Socioeconomic History   Marital status: Married    Spouse name: Not on file   Number of children: Not on file   Years of education: Not on file   Highest education level: Some college, no degree  Occupational History   Not on file  Tobacco Use   Smoking status: Never   Smokeless tobacco: Never   Tobacco comments:    Nonoe  Vaping Use   Vaping status: Never Used  Substance and Sexual Activity   Alcohol use: Yes    Comment: occ   Drug use: Never   Sexual activity: Yes  Other Topics Concern   Not on file  Social History Narrative   Not on file   Social Drivers of Health   Financial Resource Strain: Low Risk  (06/11/2024)   Overall Financial Resource Strain (CARDIA)    Difficulty of Paying Living Expenses: Not very hard  Food Insecurity: Food Insecurity Present (06/11/2024)  Hunger Vital Sign    Worried About Running Out of Food in the Last Year: Sometimes true    Ran Out of Food in the Last Year: Sometimes true  Transportation Needs: No Transportation Needs (06/11/2024)   PRAPARE - Administrator, Civil Service (Medical): No    Lack of Transportation (Non-Medical): No  Physical Activity: Sufficiently Active (06/11/2024)   Exercise Vital Sign    Days of Exercise per Week: 4 days    Minutes of Exercise per Session: 60 min  Stress: No Stress Concern Present (06/11/2024)   [employer] of  Occupational Health - Occupational Stress Questionnaire    Feeling of Stress: Only a little  Social Connections: Moderately Isolated (06/11/2024)   Social Connection and Isolation Panel    Frequency of Communication with Friends and Family: More than three times a week    Frequency of Social Gatherings with Friends and Family: More than three times a week    Attends Religious Services: Never    Database Administrator or Organizations: No    Attends Engineer, Structural: Not on file    Marital Status: Living with partner  Intimate Partner Violence: Not on file       Objective:    BP (!) 170/100 (BP Location: Left Arm, Patient Position: Sitting)   Pulse 84   Temp 98.7 F (37.1 C) (Temporal)   Ht 6' 5 (1.956 m)   Wt 185 lb (83.9 kg)   SpO2 95%   BMI 21.94 kg/m    Physical Exam Vitals and nursing note reviewed.  Constitutional:      General: He is not in acute distress.    Comments: Appears uncomfortable, unable to sit still  HENT:     Head: Normocephalic and atraumatic.     Right Ear: External ear normal.     Left Ear: External ear normal.     Nose: Nose normal.  Eyes:     Extraocular Movements: Extraocular movements intact.  Cardiovascular:     Rate and Rhythm: Normal rate and regular rhythm.  Pulmonary:     Effort: Pulmonary effort is normal.  Musculoskeletal:     Cervical back: Normal range of motion.     Thoracic back: Swelling, spasms and tenderness present. Decreased range of motion.     Lumbar back: Swelling, spasms and tenderness present. Decreased range of motion. Positive left straight leg raise test.       Back:     Right lower leg: No edema.     Left lower leg: No edema.     Comments: Area of tenderness, swelling, spasms.  Decreased range of motion with bending, twisting, sitting, standing.  No erythema, no bruising, no obvious deformity  Lymphadenopathy:     Cervical: No cervical adenopathy.  Skin:    General: Skin is warm and dry.   Neurological:     General: No focal deficit present.     Mental Status: He is alert and oriented to person, place, and time.  Psychiatric:        Mood and Affect: Mood normal.        Behavior: Behavior normal.     No results found for any visits on 06/11/24.      Assessment & Plan:   Assessment and Plan Assessment & Plan Acute left-sided low back pain with left-sided sciatica, muscle spasms Acute low back pain with muscle spasm and lumbar radiculopathy since Friday before last, with pain radiating to the foot. Previous  treatment with gabapentin  and methocarbamol  provided temporary relief. Prednisone  offered some relief, but pressure persists. Severe muscle spasm observed on examination. Current medications include Aleve and Tylenol . Significant pain impacts daily activities and sleep. - Prescribed Flexeril  (cyclobenzaprine ) three times daily. Advised caution due to potential drowsiness. - Prescribed meloxicam  with food. Advised to avoid other NSAIDs. - Prescribed hydrocodone  for breakthrough pain. Cautioned about pharmacy dispensing limitations. - Administered Toradol  injection for immediate pain relief. - Ordered lumbar spine x-ray with two views. - Scheduled follow-up in one week.  Lumbar anterolisthesis - X-ray shows grade 1 anterolisthesis of L4 on L5 with facet hypertrophy and mild disc space narrowing - Once pain is better controlled, may try rehab exercises, physical therapy     Orders Placed This Encounter  Procedures   DG Lumbar Spine 2-3 Views    Standing Status:   Future    Number of Occurrences:   1    Expiration Date:   12/09/2024    Reason for Exam (SYMPTOM  OR DIAGNOSIS REQUIRED):   low back pain    Preferred imaging location?:   [facility] [place] ordered this encounter  Medications   cyclobenzaprine  (FLEXERIL ) 10 MG tablet    Sig: Take 1 tablet (10 mg total) by mouth 3 (three) times daily as needed for muscle spasms.    Dispense:  30 tablet     Refill:  0   meloxicam  (MOBIC ) 15 MG tablet    Sig: Take 1 tablet (15 mg total) by mouth daily.    Dispense:  30 tablet    Refill:  0   HYDROcodone -acetaminophen  (NORCO/VICODIN) 5-325 MG tablet    Sig: Take 1-2 tablets by mouth every 4 (four) hours as needed for up to 5 days for severe pain (pain score 7-10) or moderate pain (pain score 4-6).    Dispense:  30 tablet    Refill:  0   ketorolac  (TORADOL ) injection 30 mg    Return in about 1 week (around 06/18/2024) for f/u back pain.  Corean LITTIE Ku, FNP   "

## 2024-06-11 ENCOUNTER — Ambulatory Visit (INDEPENDENT_AMBULATORY_CARE_PROVIDER_SITE_OTHER)

## 2024-06-11 ENCOUNTER — Ambulatory Visit: Admitting: Family Medicine

## 2024-06-11 VITALS — BP 170/100 | HR 84 | Temp 98.7°F | Ht 77.0 in | Wt 185.0 lb

## 2024-06-11 DIAGNOSIS — M62838 Other muscle spasm: Secondary | ICD-10-CM | POA: Diagnosis not present

## 2024-06-11 DIAGNOSIS — M4316 Spondylolisthesis, lumbar region: Secondary | ICD-10-CM | POA: Diagnosis not present

## 2024-06-11 DIAGNOSIS — M5432 Sciatica, left side: Secondary | ICD-10-CM

## 2024-06-11 DIAGNOSIS — M5442 Lumbago with sciatica, left side: Secondary | ICD-10-CM

## 2024-06-11 MED ORDER — KETOROLAC TROMETHAMINE 30 MG/ML IM SOLN
30.0000 mg | Freq: Once | INTRAMUSCULAR | Status: AC
  Administered 2024-06-11: 30 mg via INTRAMUSCULAR

## 2024-06-11 MED ORDER — MELOXICAM 15 MG PO TABS: 15.0000 mg | ORAL_TABLET | Freq: Every day | ORAL | 0 refills | Status: DC

## 2024-06-11 MED ORDER — HYDROCODONE-ACETAMINOPHEN 5-325 MG PO TABS: 1.0000 | ORAL_TABLET | ORAL | 0 refills | Status: AC | PRN

## 2024-06-11 MED ORDER — CYCLOBENZAPRINE HCL 10 MG PO TABS: 10.0000 mg | ORAL_TABLET | Freq: Three times a day (TID) | ORAL | 0 refills | Status: DC | PRN

## 2024-06-11 NOTE — Patient Instructions (Addendum)
 May use heat or ice to the area for relief as needed.  May use topical rubs or gels to the area as needed for relief.  Attached subtle stretching exercises to help relieve pain and strengthen muscles.   I have sent in a muscle relaxer for you to use one tablet as needed for muscle spasms. This medication can make you sleepy. Do not drive until you know how this medication affects you.   I have sent in meloxicam  for you to take once daily. This is an anti-inflammatory.   Do not take ibuprofen, Aleve, aspirin with this medication. You may take Tylenol  with this medication.  Eat with this medication, it can upset your stomach if you do not.  You have received an injection of Toradol  in office today for pain.  Do not take any NSAIDs including aspirin, Aleve, ibuprofen, Celebrex, meloxicam  within the next 6 hours after your injection.  You have received a steroid injection in the office today.

## 2024-06-12 ENCOUNTER — Ambulatory Visit: Payer: Self-pay | Admitting: Family Medicine

## 2024-06-18 ENCOUNTER — Ambulatory Visit: Admitting: Family Medicine

## 2024-06-18 ENCOUNTER — Encounter: Payer: Self-pay | Admitting: Family Medicine

## 2024-06-18 VITALS — BP 140/94 | HR 84 | Temp 98.6°F | Ht 77.0 in | Wt 185.6 lb

## 2024-06-18 DIAGNOSIS — M62838 Other muscle spasm: Secondary | ICD-10-CM | POA: Diagnosis not present

## 2024-06-18 DIAGNOSIS — M5442 Lumbago with sciatica, left side: Secondary | ICD-10-CM

## 2024-06-18 DIAGNOSIS — M4316 Spondylolisthesis, lumbar region: Secondary | ICD-10-CM | POA: Diagnosis not present

## 2024-06-18 MED ORDER — HYDROCODONE-ACETAMINOPHEN 5-325 MG PO TABS: 1.0000 | ORAL_TABLET | Freq: Four times a day (QID) | ORAL | 0 refills | Status: DC | PRN

## 2024-06-18 NOTE — Progress Notes (Unsigned)
 Acute Office Visit  Subjective:     Patient ID: Vincent Wright, male    DOB: 08/20/1971, 53 y.o.   MRN: [id number]  Chief Complaint  Patient presents with   Follow-up    Back pain still persist, has gotten a little better. Pain still radiating down the leg    HPI  Discussed the use of AI scribe software for clinical note transcription with the patient, who gave verbal consent to proceed.  History of Present Illness Vincent Wright is a 53 year old male who presents with back pain and medication management. He is accompanied by Festus, who is present during the visit.  Lumbar back pain - Significant pain in the lumbar region affecting daily activities - Pain is persistent and requires regular medication for control - Pain severity has resulted in missing a planned trip - Anterolisthesis in the lumbar spine confirmed by x-ray imaging  Analgesic and adjunctive medication use - Hydrocodone taken three times daily, administered as soon as effects wear off - Concurrent use of anti-inflammatory medication and muscle relaxants for pain management  Functional impairment and occupational impact - Excused from work until October 18th due to back pain - Works Monday through Thursday - Expresses concern about job security related to current medical condition     ROS Per HPI      Objective:    BP (!) 140/94 (BP Location: Left Arm, Patient Position: Sitting)   Pulse 84   Temp 98.6 F (37 C) (Temporal)   Ht 6' 5 (1.956 m)   Wt 185 lb 9.6 oz (84.2 kg)   SpO2 96%   BMI 22.01 kg/m    Physical Exam Vitals and nursing note reviewed.  Constitutional:      General: He is not in acute distress.    Appearance: Normal appearance.  HENT:     Head: Normocephalic and atraumatic.     Right Ear: External ear normal.     Left Ear: External ear normal.     Nose: Nose normal.     Mouth/Throat:     Mouth: Mucous membranes are moist.     Pharynx: Oropharynx is clear.  Eyes:      Extraocular Movements: Extraocular movements intact.  Cardiovascular:     Rate and Rhythm: Normal rate and regular rhythm.  Pulmonary:     Effort: Pulmonary effort is normal.  Musculoskeletal:     Cervical back: Normal range of motion.     Right lower leg: No edema.     Left lower leg: No edema.     Comments: Still having limited but improved range of motion to the lower back, left hip and leg.  Left paraspinous muscles still mildly swollen and in spasm, but improved.  No erythema, no bruising, obvious deformity  Lymphadenopathy:     Cervical: No cervical adenopathy.  Skin:    General: Skin is warm and dry.  Neurological:     General: No focal deficit present.     Mental Status: He is alert and oriented to person, place, and time.  Psychiatric:        Mood and Affect: Mood normal.        Behavior: Behavior normal.     No results found for any visits on 06/18/24.      Assessment & Plan:   Assessment and Plan Assessment & Plan Lumbar anterolisthesis with lumbosacral radicular pain Chronic lumbar anterolisthesis causing nerve impingement and radicular pain, muscle spasms. Effective pain management with medication  but requires frequent dosing.  Taking Norco 3 times a day - Short-term disability paperwork given copy to patient and faxed today - Refer to [facility] in [place] for physical therapy. - Refill hydrocodone-acetaminophen  prescription. - Schedule follow-up for October 17th to reassess condition and work status. - Continue muscle relaxers as needed, continue meloxicam  once daily     Orders Placed This Encounter  Procedures   Ambulatory referral to Physical Therapy    Referral Priority:   Routine    Referral Type:   Physical Medicine    Referral Reason:   Specialty Services Required    Requested Specialty:   Physical Therapy    Number of Visits Requested:   1     Meds ordered this encounter  Medications   HYDROcodone-acetaminophen  (NORCO/VICODIN) 5-325 MG tablet     Sig: Take 1 tablet by mouth every 6 (six) hours as needed for moderate pain (pain score 4-6).    Dispense:  30 tablet    Refill:  0    Return in about 11 days (around 06/29/2024).  Corean LITTIE Ku, FNP

## 2024-06-19 ENCOUNTER — Other Ambulatory Visit (HOSPITAL_COMMUNITY): Payer: Self-pay

## 2024-06-19 NOTE — Patient Instructions (Signed)
 I have given you copies of your short-term disability paperwork.  We will also fax this into your employer so you do not have to worry about that.  I have refilled your hydrocodone today.  I have sent in a referral for [facility] in [place].  I would have you follow-up there.  Come see me again on October 17 so that we can reevaluate with a you are able to go back to work yet or not.

## 2024-06-20 ENCOUNTER — Other Ambulatory Visit (HOSPITAL_COMMUNITY): Payer: Self-pay

## 2024-06-21 ENCOUNTER — Other Ambulatory Visit (HOSPITAL_COMMUNITY): Payer: Self-pay

## 2024-06-21 ENCOUNTER — Telehealth: Payer: Self-pay | Admitting: Pharmacy Technician

## 2024-06-21 NOTE — Telephone Encounter (Signed)
 Pharmacy Patient Advocate Encounter  Received notification from [employer] that Prior Authorization for HYDROcodone-Acetaminophen  has been APPROVED from now to 12/20/24 Will call the pharmacy to see if they can fill. The test claim didn't reverse previously and I had to call to have the ins reverse it.  PA #/Case ID/Reference #: [id number]

## 2024-06-21 NOTE — Telephone Encounter (Signed)
 Pharmacy Patient Advocate Encounter   Received notification from CoverMyMeds that prior authorization for HYDROcodone-Acetaminophen  is required/requested.   Insurance verification completed.   The patient is insured through [facility].   Per test claim: PA required; PA submitted to above mentioned insurance via Latent Key/confirmation #/EOC [id number] Status is pending

## 2024-06-29 ENCOUNTER — Ambulatory Visit: Admitting: Family Medicine

## 2024-06-29 NOTE — Progress Notes (Deleted)
   Established Patient Office Visit  Subjective:     Patient ID: Vincent Wright, male    DOB: Jan 13, 1971, 53 y.o.   MRN: [id number]  No chief complaint on file.   HPI  Discussed the use of AI scribe software for clinical note transcription with the patient, who gave verbal consent to proceed.  History of Present Illness      ROS Per HPI      Objective:    There were no vitals taken for this visit.   Physical Exam Vitals and nursing note reviewed.  Constitutional:      General: He is not in acute distress.    Appearance: Normal appearance.  HENT:     Head: Normocephalic and atraumatic.     Right Ear: External ear normal.     Left Ear: External ear normal.     Nose: Nose normal.     Mouth/Throat:     Mouth: Mucous membranes are moist.     Pharynx: Oropharynx is clear.  Eyes:     Extraocular Movements: Extraocular movements intact.  Cardiovascular:     Rate and Rhythm: Normal rate and regular rhythm.     Pulses: Normal pulses.     Heart sounds: Normal heart sounds.  Pulmonary:     Effort: Pulmonary effort is normal. No respiratory distress.     Breath sounds: Normal breath sounds. No wheezing, rhonchi or rales.  Musculoskeletal:        General: Normal range of motion.     Cervical back: Normal range of motion.     Right lower leg: No edema.     Left lower leg: No edema.  Lymphadenopathy:     Cervical: No cervical adenopathy.  Skin:    General: Skin is warm and dry.  Neurological:     General: No focal deficit present.     Mental Status: He is alert and oriented to person, place, and time.  Psychiatric:        Mood and Affect: Mood normal.        Behavior: Behavior normal.     No results found for any visits on 06/29/24.  The ASCVD Risk score (Arnett DK, et al., 2019) failed to calculate for the following reasons:   Cannot find a previous HDL lab   Cannot find a previous total cholesterol lab  {Vitals History (Optional):23777}  {Show previous  labs (optional):23779}     Assessment & Plan:   Assessment and Plan Assessment & Plan      No orders of the defined types were placed in this encounter.    No orders of the defined types were placed in this encounter.   No follow-ups on file.  Corean LITTIE Ku, FNP

## 2024-07-02 ENCOUNTER — Ambulatory Visit: Admitting: Family Medicine

## 2024-07-02 ENCOUNTER — Encounter: Payer: Self-pay | Admitting: Family Medicine

## 2024-07-02 DIAGNOSIS — M4316 Spondylolisthesis, lumbar region: Secondary | ICD-10-CM

## 2024-07-02 DIAGNOSIS — M62838 Other muscle spasm: Secondary | ICD-10-CM

## 2024-07-02 DIAGNOSIS — M5442 Lumbago with sciatica, left side: Secondary | ICD-10-CM

## 2024-07-02 NOTE — Progress Notes (Signed)
 Acute Office Visit  Subjective:     Patient ID: SHELDON SEM, male    DOB: 1971-07-08, 53 y.o.   MRN: [id number]  Chief Complaint  Patient presents with   Follow-up    HPI  Discussed the use of AI scribe software for clinical note transcription with the patient, who gave verbal consent to proceed.  History of Present Illness Shahzaib L Schoch is a 53 year old male who presents with persistent back pain and difficulty walking.  Back pain - Persistent back pain localized to the back without radiation to the leg - Pain intensifies after walking short distances, requiring him to sit down  Gait disturbance - Difficulty walking due to back pain - Occasional episodes of leg giving out while walking, but continues ambulation despite this  Imaging and diagnostic evaluation - X-ray performed three weeks ago - CT scan previously performed for kidney stone, did not focus on the back - Difficulty tolerating MRI procedures due to claustrophobia     ROS Per HPI      Objective:    BP 130/86 (BP Location: Left Arm, Patient Position: Sitting)   Pulse 85   Temp 98.6 F (37 C) (Temporal)   Ht 6' 5 (1.956 m)   Wt 186 lb (84.4 kg)   SpO2 94%   BMI 22.06 kg/m    Physical Exam Vitals and nursing note reviewed.  Constitutional:      General: He is not in acute distress.    Appearance: Normal appearance.  HENT:     Head: Normocephalic and atraumatic.     Right Ear: External ear normal.     Left Ear: External ear normal.     Nose: Nose normal.  Eyes:     Extraocular Movements: Extraocular movements intact.  Cardiovascular:     Rate and Rhythm: Normal rate.     Pulses: Normal pulses.  Pulmonary:     Effort: Pulmonary effort is normal.  Musculoskeletal:     Cervical back: Normal range of motion.     Right lower leg: No edema.     Left lower leg: No edema.     Comments: Mild L ROM to low back and left hip.  Mildly positive straight leg raise on the left, negative on the  right.  Improvement in swelling and tenderness to left low back.  Lymphadenopathy:     Cervical: No cervical adenopathy.  Skin:    General: Skin is warm and dry.  Neurological:     General: No focal deficit present.     Mental Status: He is alert and oriented to person, place, and time.  Psychiatric:        Mood and Affect: Mood normal.        Behavior: Behavior normal.     No results found for any visits on 07/02/24.      Assessment & Plan:   Assessment and Plan Assessment & Plan Lumbar anterolisthesis with lumbosacral radiculopathy, muscle spasm, left low back pain with sciatica Chronic lumbar radiculopathy causing persistent pain and occasional leg weakness. MRI deferred due to claustrophobia. - Refer to physical therapy with [facility] for more options and quicker access.  - Requesting extension of short term disability, was scheduled to return to work today but was in too much pain    Orders Placed This Encounter  Procedures   Ambulatory referral to Physical Therapy    Referral Priority:   Emergency    Referral Type:  Physical Medicine    Referral Reason:   Specialty Services Required    Requested Specialty:   Physical Therapy    Number of Visits Requested:   1     No orders of the defined types were placed in this encounter.   Return for 2 weeks after starting PT.  Corean LITTIE Ku, FNP

## 2024-07-02 NOTE — Patient Instructions (Signed)
 We have placed a referral for you today. Someone will be reaching out to get you scheduled.

## 2024-07-03 ENCOUNTER — Telehealth: Payer: Self-pay

## 2024-07-03 NOTE — Telephone Encounter (Signed)
 Copied from CRM #[id number]. Topic: General - Call Back - No Documentation >> Jul 03, 2024 10:22 AM Rea BROCKS wrote: Reason for CRM: [facility] called to verify if clinic can provide patients workers comp information with [employer]. They received the referral put patient would like to use workers comp for it.   [phone] [place]

## 2024-07-03 NOTE — Telephone Encounter (Signed)
 Copied from CRM [id number]. Topic: Referral - Question >> Jul 03, 2024 10:30 AM Alfonso ORN wrote: Reason for CRM: [facility] called regarding authorization for PA. She stated that it was to be covered by pt employer [employer]) workers comp but only his personal insurance is being displayed and they are requesting employer insurance information and contact    Please advise [phone]

## 2024-07-05 ENCOUNTER — Other Ambulatory Visit: Payer: Self-pay | Admitting: Family Medicine

## 2024-07-05 DIAGNOSIS — M5442 Lumbago with sciatica, left side: Secondary | ICD-10-CM

## 2024-07-11 ENCOUNTER — Ambulatory Visit: Attending: Family Medicine

## 2024-07-11 DIAGNOSIS — M5432 Sciatica, left side: Secondary | ICD-10-CM | POA: Insufficient documentation

## 2024-07-11 DIAGNOSIS — M4316 Spondylolisthesis, lumbar region: Secondary | ICD-10-CM | POA: Diagnosis not present

## 2024-07-11 DIAGNOSIS — M5442 Lumbago with sciatica, left side: Secondary | ICD-10-CM | POA: Insufficient documentation

## 2024-07-11 DIAGNOSIS — M5459 Other low back pain: Secondary | ICD-10-CM | POA: Diagnosis present

## 2024-07-11 DIAGNOSIS — M6281 Muscle weakness (generalized): Secondary | ICD-10-CM | POA: Diagnosis present

## 2024-07-11 DIAGNOSIS — M62838 Other muscle spasm: Secondary | ICD-10-CM | POA: Insufficient documentation

## 2024-07-11 DIAGNOSIS — R2689 Other abnormalities of gait and mobility: Secondary | ICD-10-CM | POA: Insufficient documentation

## 2024-07-11 NOTE — Therapy (Signed)
 OUTPATIENT PHYSICAL THERAPY THORACOLUMBAR EVALUATION/TREATMENT   Patient Name: Vincent Wright MRN: [id number] DOB:[birth year], 53 y.o., male Today's Date: 07/11/2024  END OF SESSION:  PT End of Session - 07/11/24 1029     Visit Number 1    Number of Visits 17    Date for Recertification  09/05/24    PT Start Time 1030    PT Stop Time 1110    PT Time Calculation (min) 40 min    Activity Tolerance Patient tolerated treatment well    Behavior During Therapy [facility] for tasks assessed/performed          Past Medical History:  Diagnosis Date   Glaucoma 10/2017   History reviewed. No pertinent surgical history. Patient Active Problem List   Diagnosis Date Noted   Acute left-sided low back pain with left-sided sciatica 06/11/2024   Muscle spasm 06/11/2024   Anterolisthesis of lumbar spine 06/11/2024    PCP: Alvia Corean LITTIE, FNP  REFERRING PROVIDER: Alvia Corean LITTIE, *  REFERRING DIAG:  M43.16 (ICD-10-CM) - Anterolisthesis of lumbar spine  M54.42 (ICD-10-CM) - Acute left-sided low back pain with left-sided sciatica  M62.838 (ICD-10-CM) - Muscle spasm    RATIONALE FOR EVALUATION AND TREATMENT: Rehabilitation  THERAPY DIAG: Other low back pain - Plan: PT plan of care cert/re-cert  Sciatica, left side - Plan: PT plan of care cert/re-cert  Muscle weakness (generalized) - Plan: PT plan of care cert/re-cert  Other abnormalities of gait and mobility - Plan: PT plan of care cert/re-cert  ONSET DATE: 06/01/2024.  FOLLOW-UP APPT SCHEDULED WITH REFERRING PROVIDER: Didn't address     SUBJECTIVE:                                                                                                                                                                                         SUBJECTIVE STATEMENT:    Patient is a 53 year old male presenting to OPPT with a chief concern of Lower back Pain.   PERTINENT HISTORY:   Saud Bail is a 53 year old male presenting with  lower back pain. Patient reports initial onset occurred while turning and transitioning from bed. Sharp pain was noted and he reported to the ED on 06/01/2024 due to pain intensity. Patient reports that the pain starts in the lower back and radiates into the L buttock and posterior thigh. Pain has minimally improved since initial onset with mitigation from prescription medications.   The patient's pain is aggravated with prolonged walking, standing, sitting. Pain alleviated with heat and pain medications. Patient reports that the pain affects his sleep cycle and he is able to sleep 3-4 hours a night.  Patient's pain has limited from performing work related tasks. 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   Dominant hand: right  Red flags: Negative for bowel/bladder changes, saddle paresthesia, direct trauma to lower back   Imaging: Yes   CLINICAL DATA:  Left-sided sciatica.  Low back pain since 06/01/2024   EXAM: LUMBAR SPINE - 2-3 VIEW   COMPARISON:  Reformats from abdominopelvic CT 06/01/2024   FINDINGS: Five non-rib-bearing lumbar vertebra with diminutive ribs at T12. Mild broad-based dextroscoliotic curvature. 6 mm anterolisthesis of L4 on L5. Facet hypertrophy and mild disc space narrowing at this level. No pars defects. Remaining disc spaces are preserved. Vertebral body heights are normal. No evidence of fracture or focal bone abnormality. The iliac joints are normal.   IMPRESSION: 1. Mild broad-based dextroscoliotic curvature. 2. Grade 1 anterolisthesis of L4 on L5 with facet hypertrophy and mild disc space narrowing.     Electronically Signed   By: Andrea Gasman M.D.   On: 06/11/2024 15:17  PAIN:    Pain Intensity: Present: 6/10, Best: 6/10, Worst: 8/10 Pain location: Lower Back and L buttock  Pain Quality: constant, sharp, and burning  Radiating: Yes  Numbness/Tingling: Yes Relieving factors: Prescription  Medication History of prior back injury, pain, surgery, or therapy: No  PRECAUTIONS: Fall  WEIGHT BEARING RESTRICTIONS: No  FALLS: Has patient fallen in last 6 months? No  Living Environment Lives with: lives with their family Lives in: House/apartment Stairs: Yes: External: 5 steps; on left going up Has following equipment at home: Ramped entry  Prior level of function: Independent  Occupational demands: Lifting to 50#   Hobbies: Baseball, [employer], Football Dentist)   PATIENT GOALS: Walk and lift without any pain.    OBJECTIVE:  Patient Surveys  Modified Oswestry:  MODIFIED OSWESTRY DISABILITY SCALE  Date: 07/11/2024 Score  Pain intensity 3 =  Pain medication provides me with moderate relief from pain.  2. Personal care (washing, dressing, etc.) 0 =  I can take care of myself normally without causing increased pain.  3. Lifting 3 = Pain prevents me from lifting heavy weights, but I can manage light to medium weights if they are conveniently positioned  4. Walking 3 =  Pain prevents me from walking more than  mile.  5. Sitting 2 =  Pain prevents me from sitting more than 1 hour.  6. Standing 4 =  Pain prevents me from standing more than 10 minutes.  7. Sleeping 3 =  Even when I take pain medication, I sleep less than 4 hours.  8. Social Life 2 = Pain prevents me from participating in more energetic activities (eg. sports, dancing).  9. Traveling 3 = My pain restricts my travel over 1 hour  10. Employment/ Homemaking 4 = Pain prevents me from doing even light duties.  Total 27/50   Interpretation of scores: Score Category Description  0-20% Minimal Disability The patient can cope with most living activities. Usually no treatment is indicated apart from advice on lifting, sitting and exercise  21-40% Moderate Disability The patient experiences more pain and difficulty with sitting, lifting and standing. Travel and social life are more difficult and they may be disabled  from work. Personal care, sexual activity and sleeping are not grossly affected, and the patient can usually be managed by conservative means  41-60% Severe Disability Pain remains the main problem in this group, but activities of daily living are affected. These patients require a  detailed investigation  61-80% Crippled Back pain impinges on all aspects of the patient's life. Positive intervention is required  81-100% Bed-bound These patients are either bed-bound or exaggerating their symptoms  Vincent Wright Zoe DELENA Karon DELENA, et al. Surgery versus conservative management of stable thoracolumbar fracture: the PRESTO feasibility RCT. [place] ([place]): [employer]; 2021 Nov. [facility] Technology Assessment, No. 25.62.) Appendix 3, Oswestry Disability Index category descriptors. Available from: Findjewelers.cz  Minimally Clinically Important Difference (MCID) = 12.8%  Cognition Patient is oriented to person, place, and time.  Recent memory is intact.    Gross Musculoskeletal Assessment Tremor: None Bulk: Normal Tone: Normal No visible step-off along spinal column, no signs of scoliosis  GAIT: Distance walked: 63m  Assistive device utilized: None Level of assistance: Complete Independence Comments:   Posture: Lumbar lordosis: WNL Iliac crest height: Equal bilaterally Lumbar lateral shift: Negative  AROM AROM (Normal range in degrees) AROM   Lumbar   Flexion (65) 100%  Extension (30) 50%  Right lateral flexion (25) 100%*  Left lateral flexion (25) 100%*  Right rotation (30)   Left rotation (30)       Hip Right Left  Flexion (125)    Extension (15)    Abduction (40)    Adduction     Internal Rotation (45) WNL   External Rotation (45) 30 30      Knee    Flexion (135)    Extension (0)        Ankle    Dorsiflexion (20)    Plantarflexion (50)    Inversion (35)    Eversion (15)    (* = pain; Blank rows = not tested)  LE MMT: MMT  (out of 5) Right  Left   Hip flexion 4- 4-  Hip extension    Hip abduction    Hip adduction    Hip internal rotation 4 4  Hip external rotation 4 4  Knee flexion 4- 4-  Knee extension 4- 4-  Ankle dorsiflexion 5 5  Ankle plantarflexion 5 5  Ankle inversion    Ankle eversion    (* = pain; Blank rows = not tested)  Sensation Grossly intact to light touch throughout bilateral LEs as determined by testing dermatomes L2-S2. Proprioception, stereognosis, and hot/cold testing deferred on this date.  Reflexes R/L Knee Jerk (L3/4): 2+/2+   Muscle Length Hamstrings: R:Negative, 20 deg  L: Positive for tightness and pain, 35 deg   Palpation Location Right Left         Lumbar paraspinals  1  Quadratus Lumborum    Gluteus Maximus  1  Gluteus Medius    Deep hip external rotators  2  PSIS    Fortin's Area (SIJ)    Greater Trochanter    (Blank rows = not tested) Graded on 0-4 scale (0 = no pain, 1 = pain, 2 = pain with wincing/grimacing/flinching, 3 = pain with withdrawal, 4 = unwilling to allow palpation)  Passive Accessory  Motion Pt denies reproduction of back pain with CPA L1-L5 and UPA bilaterally L1-L5. Generally, hypomobile throughout  Special Tests Lumbar Radiculopathy and Discogenic: Centralization and Peripheralization (SN 92, -LR 0.12): Not examined SLR (SN 92, -LR 0.29): R: Negative L:  Positive Crossed SLR (SP 90): Not Examined   Lumbar Foraminal Stenosis: Lumbar quadrant (SN 70): Not Tested   Hip: FABER (SN 81): R: Positive for pain and tightness L: Positive for pain and tightness  TODAY'S TREATMENT: DATE: 07/11/2024   Therapeutic Exercise:  Reviewed  HEP with return demonstration    Access Code: [email or web address] URL: [email or web address] Date: 07/11/2024 Prepared by: Lonni Pall  Exercises - Supine Bridge  - 1 x daily - 3-4 x weekly - 3 sets - 10 reps - Clam with Resistance  - 1 x daily - 7 x weekly - 3 sets - 10 reps - Supine Sciatic Nerve  Glide  - 1-2 x daily - 7 x weekly - 2-3 sets - 10 reps - Supine Lower Trunk Rotation  - 1-2 x daily - 7 x weekly - 2-3 sets - 10-15 reps - Supine Piriformis Stretch with Foot on Ground  - 1-2 x daily - 7 x weekly - 3 sets - 10 reps - Hooklying Single Knee to Chest  - 1 x daily - 7 x weekly - 3 sets - 10 reps   PATIENT EDUCATION:  Education details: HEP, POC, Anatomy, Prognosis Person educated: Patient Education method: Explanation, Demonstration, and Handouts Education comprehension: verbalized understanding, returned demonstration, and needs further education   HOME EXERCISE PROGRAM:  Access Code: [email or web address] URL: [email or web address] Date: 07/11/2024 Prepared by: Lonni Pall  Exercises - Supine Bridge  - 1 x daily - 3-4 x weekly - 3 sets - 10 reps - Clam with Resistance  - 1 x daily - 7 x weekly - 3 sets - 10 reps - Supine Sciatic Nerve Glide  - 1-2 x daily - 7 x weekly - 2-3 sets - 10 reps - Supine Lower Trunk Rotation  - 1-2 x daily - 7 x weekly - 2-3 sets - 10-15 reps - Supine Piriformis Stretch with Foot on Ground  - 1-2 x daily - 7 x weekly - 3 sets - 10 reps - Hooklying Single Knee to Chest  - 1 x daily - 7 x weekly - 3 sets - 10 reps   ASSESSMENT:  CLINICAL IMPRESSION: Patient is a 53 y.o. male who was seen today for physical therapy evaluation and treatment for lower back pain. Patient presenting with limited ROM secondary to pain, decreased strength and diffuse pain throughout lower back into L posterior thigh. L hamstring length test positive for increased muscle tension and concordant pain at end range. Tenderness to palpation at gluteal muscle group and lumbar paraspinals bilaterally (L > R). Gait abnormalities include decreased stride length, decreased step length on the L and and decreased stance time on the Left. FABER position with reproduction of gluteal pain and tightness. Initial HEP provided to patient in order to implement core strengthening,  neural glides and muscle lengthening. Improved pain response at end of session following HEP exercises. Based on today's session patient will benefit from skilled PT services in order maximize return to PLOF and improve QoL.   OBJECTIVE IMPAIRMENTS: Abnormal gait, decreased balance, difficulty walking, decreased ROM, decreased strength, and pain.   ACTIVITY LIMITATIONS: carrying, lifting, bending, standing, squatting, stairs, and transfers  PARTICIPATION LIMITATIONS: shopping, community activity, occupation, and yard work  PERSONAL FACTORS: Fitness, Past/current experiences, Profession, Time since onset of injury/illness/exacerbation, and 1 comorbidity: Anterolisthesis of Lumbar Spine  are also affecting patient's functional outcome.   REHAB POTENTIAL: Good  CLINICAL DECISION MAKING: Evolving/moderate complexity  EVALUATION COMPLEXITY: Moderate   GOALS: Goals reviewed with patient? No  SHORT TERM GOALS: Target date: 08/08/2024  Pt will be independent with HEP in order to improve strength and decrease back pain to improve pain-free function at home and work. Baseline:  07/11/24: Initial provided Goal status: INITIAL   LONG TERM GOALS: Target date: 09/05/2024  Pt will  be able to squat and lift 40# with proper squatting mechanics in order to demonstrate clinically significant reduction in back pain and good ability to return to work.  Baseline: 07/11/24: not tested Goal status: INITIAL  2.  Pt will decrease worst back pain by at least 2 points on the NPRS in order to demonstrate clinically significant reduction in back pain. Baseline:  07/11/24: 8/10 NPS  Goal status: INITIAL  3.  Pt will decrease mODI score by at least 13 points in order demonstrate clinically significant reduction in back pain/disability.       Baseline: 07/11/2024: 27/50 = 54% (moderate disability)  Goal status: INITIAL  4.  Pt will achieve 6-8 hours of sleep per night without report of awakening from pain in  order to demonstrate significant improvements in pain. Baseline: 07/11/2024: 3-4 hours Goal status: INITIAL  4.  Pt will increase by at least 0.13 m/s in order to demonstrate clinically significant improvement in community ambulation.  Baseline: 07/11/2024: .74 (Self Selected) without AD Goal status: INITIAL  PLAN:  PT FREQUENCY: 1-2x/week  PT DURATION: 8 weeks  PLANNED INTERVENTIONS: 97110-Therapeutic exercises, 97530- Therapeutic activity, 97112- Neuromuscular re-education, 97535- Self Care, [id number]- Manual therapy, [phone]- Gait training, Patient/Family education, Balance training, Joint manipulation, Spinal mobilization, Cryotherapy, and Moist heat  PLAN FOR NEXT SESSION: Review HEP, Initiate Gluteal Strengthening, Flexion based core exercises.   Lonni Pall PT, DPT Physical Therapist-   07/11/2024, 1:08 PM

## 2024-07-18 ENCOUNTER — Ambulatory Visit

## 2024-07-18 NOTE — Therapy (Incomplete)
 OUTPATIENT PHYSICAL THERAPY THORACOLUMBAR TREATMENT   Patient Name: Vincent Wright MRN: [id number] DOB:[birth year], 53 y.o., male Today's Date: 07/18/2024  END OF SESSION:    Past Medical History:  Diagnosis Date   Glaucoma 10/2017   No past surgical history on file. Patient Active Problem List   Diagnosis Date Noted   Acute left-sided low back pain with left-sided sciatica 06/11/2024   Muscle spasm 06/11/2024   Anterolisthesis of lumbar spine 06/11/2024    PCP: Alvia Corean LITTIE, FNP  REFERRING PROVIDER: Alvia Corean LITTIE, *  REFERRING DIAG:  M43.16 (ICD-10-CM) - Anterolisthesis of lumbar spine  M54.42 (ICD-10-CM) - Acute left-sided low back pain with left-sided sciatica  M62.838 (ICD-10-CM) - Muscle spasm    RATIONALE FOR EVALUATION AND TREATMENT: Rehabilitation  THERAPY DIAG: Other low back pain  Sciatica, left side  Muscle weakness (generalized)  Other abnormalities of gait and mobility  ONSET DATE: 06/01/2024.  FOLLOW-UP APPT SCHEDULED WITH REFERRING PROVIDER: Didn't address     SUBJECTIVE:                                                                                                                                                                                         SUBJECTIVE STATEMENT:    Patient is a 53 year old male presenting to OPPT with a chief concern of Lower back Pain.   PERTINENT HISTORY:   Vincent Wright is a 53 year old male presenting with lower back pain. Patient reports initial onset occurred while turning and transitioning from bed. Sharp pain was noted and he reported to the ED on 06/01/2024 due to pain intensity. Patient reports that the pain starts in the lower back and radiates into the L buttock and posterior thigh. Pain has minimally improved since initial onset with mitigation from prescription medications.   The patient's pain is aggravated with prolonged walking, standing, sitting. Pain alleviated with heat and pain  medications. Patient reports that the pain affects his sleep cycle and he is able to sleep 3-4 hours a night. Patient's pain has limited from performing work related tasks. 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   Dominant hand: right  Red flags: Negative for bowel/bladder changes, saddle paresthesia, direct trauma to lower back   Imaging: Yes   CLINICAL DATA:  Left-sided sciatica.  Low back pain since 06/01/2024   EXAM: LUMBAR SPINE - 2-3 VIEW   COMPARISON:  Reformats from abdominopelvic CT 06/01/2024   FINDINGS: Five non-rib-bearing lumbar vertebra with diminutive ribs at T12. Mild broad-based dextroscoliotic curvature. 6 mm anterolisthesis of L4 on L5. Facet hypertrophy and mild disc  space narrowing at this level. No pars defects. Remaining disc spaces are preserved. Vertebral body heights are normal. No evidence of fracture or focal bone abnormality. The iliac joints are normal.   IMPRESSION: 1. Mild broad-based dextroscoliotic curvature. 2. Grade 1 anterolisthesis of L4 on L5 with facet hypertrophy and mild disc space narrowing.     Electronically Signed   By: Andrea Gasman M.D.   On: 06/11/2024 15:17  PAIN:    Pain Intensity: Present: 6/10, Best: 6/10, Worst: 8/10 Pain location: Lower Back and L buttock  Pain Quality: constant, sharp, and burning  Radiating: Yes  Numbness/Tingling: Yes Relieving factors: Prescription Medication History of prior back injury, pain, surgery, or therapy: No  PRECAUTIONS: Fall  WEIGHT BEARING RESTRICTIONS: No  FALLS: Has patient fallen in last 6 months? No  Living Environment Lives with: lives with their family Lives in: House/apartment Stairs: Yes: External: 5 steps; on left going up Has following equipment at home: Ramped entry  Prior level of function: Independent  Occupational demands: Lifting to 50#   Hobbies: Baseball, [employer], Football Dentist)    PATIENT GOALS: Walk and lift without any pain.    OBJECTIVE:  Patient Surveys  Modified Oswestry:  MODIFIED OSWESTRY DISABILITY SCALE  Date: 07/11/2024 Score  Pain intensity 3 =  Pain medication provides me with moderate relief from pain.  2. Personal care (washing, dressing, etc.) 0 =  I can take care of myself normally without causing increased pain.  3. Lifting 3 = Pain prevents me from lifting heavy weights, but I can manage light to medium weights if they are conveniently positioned  4. Walking 3 =  Pain prevents me from walking more than  mile.  5. Sitting 2 =  Pain prevents me from sitting more than 1 hour.  6. Standing 4 =  Pain prevents me from standing more than 10 minutes.  7. Sleeping 3 =  Even when I take pain medication, I sleep less than 4 hours.  8. Social Life 2 = Pain prevents me from participating in more energetic activities (eg. sports, dancing).  9. Traveling 3 = My pain restricts my travel over 1 hour  10. Employment/ Homemaking 4 = Pain prevents me from doing even light duties.  Total 27/50   Interpretation of scores: Score Category Description  0-20% Minimal Disability The patient can cope with most living activities. Usually no treatment is indicated apart from advice on lifting, sitting and exercise  21-40% Moderate Disability The patient experiences more pain and difficulty with sitting, lifting and standing. Travel and social life are more difficult and they may be disabled from work. Personal care, sexual activity and sleeping are not grossly affected, and the patient can usually be managed by conservative means  41-60% Severe Disability Pain remains the main problem in this group, but activities of daily living are affected. These patients require a detailed investigation  61-80% Crippled Back pain impinges on all aspects of the patient's life. Positive intervention is required  81-100% Bed-bound These patients are either bed-bound or exaggerating their  symptoms  Bluford FORBES Zoe DELENA Karon DELENA, et al. Surgery versus conservative management of stable thoracolumbar fracture: the PRESTO feasibility RCT. [place] ([place]): [employer]; 2021 Nov. [facility] Technology Assessment, No. 25.62.) Appendix 3, Oswestry Disability Index category descriptors. Available from: Findjewelers.cz  Minimally Clinically Important Difference (MCID) = 12.8%  Cognition Patient is oriented to person, place, and time.  Recent memory is intact.    Gross Musculoskeletal Assessment Tremor: None Bulk:  Normal Tone: Normal No visible step-off along spinal column, no signs of scoliosis  GAIT: Distance walked: 74m  Assistive device utilized: None Level of assistance: Complete Independence Comments:   Posture: Lumbar lordosis: WNL Iliac crest height: Equal bilaterally Lumbar lateral shift: Negative  AROM AROM (Normal range in degrees) AROM   Lumbar   Flexion (65) 100%  Extension (30) 50%  Right lateral flexion (25) 100%*  Left lateral flexion (25) 100%*  Right rotation (30)   Left rotation (30)       Hip Right Left  Flexion (125)    Extension (15)    Abduction (40)    Adduction     Internal Rotation (45) WNL   External Rotation (45) 30 30      Knee    Flexion (135)    Extension (0)        Ankle    Dorsiflexion (20)    Plantarflexion (50)    Inversion (35)    Eversion (15)    (* = pain; Blank rows = not tested)  LE MMT: MMT (out of 5) Right  Left   Hip flexion 4- 4-  Hip extension    Hip abduction    Hip adduction    Hip internal rotation 4 4  Hip external rotation 4 4  Knee flexion 4- 4-  Knee extension 4- 4-  Ankle dorsiflexion 5 5  Ankle plantarflexion 5 5  Ankle inversion    Ankle eversion    (* = pain; Blank rows = not tested)  Sensation Grossly intact to light touch throughout bilateral LEs as determined by testing dermatomes L2-S2. Proprioception, stereognosis, and hot/cold testing  deferred on this date.  Reflexes R/L Knee Jerk (L3/4): 2+/2+   Muscle Length Hamstrings: R:Negative, 20 deg  L: Positive for tightness and pain, 35 deg   Palpation Location Right Left         Lumbar paraspinals  1  Quadratus Lumborum    Gluteus Maximus  1  Gluteus Medius    Deep hip external rotators  2  PSIS    Fortin's Area (SIJ)    Greater Trochanter    (Blank rows = not tested) Graded on 0-4 scale (0 = no pain, 1 = pain, 2 = pain with wincing/grimacing/flinching, 3 = pain with withdrawal, 4 = unwilling to allow palpation)  Passive Accessory  Motion Pt denies reproduction of back pain with CPA L1-L5 and UPA bilaterally L1-L5. Generally, hypomobile throughout  Special Tests Lumbar Radiculopathy and Discogenic: Centralization and Peripheralization (SN 92, -LR 0.12): Not examined SLR (SN 92, -LR 0.29): R: Negative L:  Positive Crossed SLR (SP 90): Not Examined   Lumbar Foraminal Stenosis: Lumbar quadrant (SN 70): Not Tested   Hip: FABER (SN 81): R: Positive for pain and tightness L: Positive for pain and tightness  TODAY'S TREATMENT: DATE: 07/18/24  Subjective:   Therapeutic Exercise:  Reviewed HEP with return demonstration    Access Code: [email or web address] URL: [email or web address] Date: 07/11/2024 Prepared by: Lonni Pall  Exercises - Supine Bridge  - 1 x daily - 3-4 x weekly - 3 sets - 10 reps - Clam with Resistance  - 1 x daily - 7 x weekly - 3 sets - 10 reps - Supine Sciatic Nerve Glide  - 1-2 x daily - 7 x weekly - 2-3 sets - 10 reps - Supine Lower Trunk Rotation  - 1-2 x daily - 7 x weekly - 2-3 sets - 10-15 reps - Supine Piriformis Stretch with Foot on  Ground  - 1-2 x daily - 7 x weekly - 3 sets - 10 reps - Hooklying Single Knee to Chest  - 1 x daily - 7 x weekly - 3 sets - 10 reps   PATIENT EDUCATION:  Education details: HEP, POC, Anatomy, Prognosis Person educated: Patient Education method: Explanation, Demonstration, and  Handouts Education comprehension: verbalized understanding, returned demonstration, and needs further education   HOME EXERCISE PROGRAM:  Access Code: [email or web address] URL: [email or web address] Date: 07/11/2024 Prepared by: Lonni Pall  Exercises - Supine Bridge  - 1 x daily - 3-4 x weekly - 3 sets - 10 reps - Clam with Resistance  - 1 x daily - 7 x weekly - 3 sets - 10 reps - Supine Sciatic Nerve Glide  - 1-2 x daily - 7 x weekly - 2-3 sets - 10 reps - Supine Lower Trunk Rotation  - 1-2 x daily - 7 x weekly - 2-3 sets - 10-15 reps - Supine Piriformis Stretch with Foot on Ground  - 1-2 x daily - 7 x weekly - 3 sets - 10 reps - Hooklying Single Knee to Chest  - 1 x daily - 7 x weekly - 3 sets - 10 reps   ASSESSMENT:  CLINICAL IMPRESSION: Patient returns to OPPT for first f/u in management of LBP. Time spent reviewing HEP with good return demonstration from patient. He tolerated all progressions to exercies today without pain in the lower back. Patient still presenting with moderate to significant lower back pain limiting full participation in recreational activities and prolonged walking. Patient will still continue to benefit from skilled PT services in order to address current deficits and maximize return to PLOF.   ***.    OBJECTIVE IMPAIRMENTS: Abnormal gait, decreased balance, difficulty walking, decreased ROM, decreased strength, and pain.   ACTIVITY LIMITATIONS: carrying, lifting, bending, standing, squatting, stairs, and transfers  PARTICIPATION LIMITATIONS: shopping, community activity, occupation, and yard work  PERSONAL FACTORS: Fitness, Past/current experiences, Profession, Time since onset of injury/illness/exacerbation, and 1 comorbidity: Anterolisthesis of Lumbar Spine  are also affecting patient's functional outcome.   REHAB POTENTIAL: Good  CLINICAL DECISION MAKING: Evolving/moderate complexity  EVALUATION COMPLEXITY: Moderate   GOALS: Goals  reviewed with patient? No  SHORT TERM GOALS: Target date: 08/08/2024  Pt will be independent with HEP in order to improve strength and decrease back pain to improve pain-free function at home and work. Baseline:  07/11/24: Initial provided Goal status: INITIAL   LONG TERM GOALS: Target date: 09/05/2024  Pt will be able to squat and lift 40# with proper squatting mechanics in order to demonstrate clinically significant reduction in back pain and good ability to return to work.  Baseline: 07/11/24: not tested Goal status: INITIAL  2.  Pt will decrease worst back pain by at least 2 points on the NPRS in order to demonstrate clinically significant reduction in back pain. Baseline:  07/11/24: 8/10 NPS  Goal status: INITIAL  3.  Pt will decrease mODI score by at least 13 points in order demonstrate clinically significant reduction in back pain/disability.       Baseline: 07/11/2024: 27/50 = 54% (moderate disability)  Goal status: INITIAL  4.  Pt will achieve 6-8 hours of sleep per night without report of awakening from pain in order to demonstrate significant improvements in pain. Baseline: 07/11/2024: 3-4 hours Goal status: INITIAL  4.  Pt will increase by at least 0.13 m/s in order to demonstrate clinically significant improvement in community ambulation.  Baseline: 07/11/2024: .74 (  Self Selected) without AD Goal status: INITIAL  PLAN:  PT FREQUENCY: 1-2x/week  PT DURATION: 8 weeks  PLANNED INTERVENTIONS: 97110-Therapeutic exercises, 97530- Therapeutic activity, [id number]- Neuromuscular re-education, 97535- Self Care, [id number]- Manual therapy, [phone]- Gait training, Patient/Family education, Balance training, Joint manipulation, Spinal mobilization, Cryotherapy, and Moist heat  PLAN FOR NEXT SESSION: Review HEP, Progress Gluteal Strengthening, Flexion based core exercises.   Lonni Pall PT, DPT Physical Therapist- [place]  07/18/2024, 9:09 AM

## 2024-07-23 ENCOUNTER — Encounter: Payer: Self-pay | Admitting: Family Medicine

## 2024-07-24 ENCOUNTER — Ambulatory Visit: Attending: Family Medicine

## 2024-07-24 DIAGNOSIS — R2689 Other abnormalities of gait and mobility: Secondary | ICD-10-CM | POA: Insufficient documentation

## 2024-07-24 DIAGNOSIS — M5459 Other low back pain: Secondary | ICD-10-CM | POA: Diagnosis present

## 2024-07-24 DIAGNOSIS — M5432 Sciatica, left side: Secondary | ICD-10-CM | POA: Diagnosis present

## 2024-07-24 DIAGNOSIS — M6281 Muscle weakness (generalized): Secondary | ICD-10-CM | POA: Insufficient documentation

## 2024-07-24 NOTE — Therapy (Signed)
 OUTPATIENT PHYSICAL THERAPY THORACOLUMBAR TREATMENT   Patient Name: Vincent Wright MRN: [id number] DOB:09/25/1970, 53 y.o., male Today's Date: 07/24/2024  END OF SESSION:  PT End of Session - 07/24/24 1518     Visit Number 2    Number of Visits 17    Date for Recertification  09/05/24    PT Start Time 1517    PT Stop Time 1558    PT Time Calculation (min) 41 min    Activity Tolerance Patient tolerated treatment well    Behavior During Therapy [facility] for tasks assessed/performed           Past Medical History:  Diagnosis Date   Glaucoma 10/2017   No past surgical history on file. Patient Active Problem List   Diagnosis Date Noted   Acute left-sided low back pain with left-sided sciatica 06/11/2024   Muscle spasm 06/11/2024   Anterolisthesis of lumbar spine 06/11/2024    PCP: Alvia Corean LITTIE, FNP  REFERRING PROVIDER: Alvia Corean LITTIE, *  REFERRING DIAG:  M43.16 (ICD-10-CM) - Anterolisthesis of lumbar spine  M54.42 (ICD-10-CM) - Acute left-sided low back pain with left-sided sciatica  M62.838 (ICD-10-CM) - Muscle spasm    RATIONALE FOR EVALUATION AND TREATMENT: Rehabilitation  THERAPY DIAG: Other low back pain  Sciatica, left side  Muscle weakness (generalized)  Other abnormalities of gait and mobility  ONSET DATE: 06/01/2024.  FOLLOW-UP APPT SCHEDULED WITH REFERRING PROVIDER: Didn't address     SUBJECTIVE:                                                                                                                                                                                         SUBJECTIVE STATEMENT:    Patient is a 53 year old male presenting to OPPT with a chief concern of Lower back Pain.   PERTINENT HISTORY:   Vincent Wright is a 53 year old male presenting with lower back pain. Patient reports initial onset occurred while turning and transitioning from bed. Sharp pain was noted and he reported to the ED on 06/01/2024 due to pain  intensity. Patient reports that the pain starts in the lower back and radiates into the L buttock and posterior thigh. Pain has minimally improved since initial onset with mitigation from prescription medications.   The patient's pain is aggravated with prolonged walking, standing, sitting. Pain alleviated with heat and pain medications. Patient reports that the pain affects his sleep cycle and he is able to sleep 3-4 hours a night. Patient's pain has limited from performing work related tasks. 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  Dominant hand: right  Red flags: Negative for bowel/bladder changes, saddle paresthesia, direct trauma to lower back   Imaging: Yes   CLINICAL DATA:  Left-sided sciatica.  Low back pain since 06/01/2024   EXAM: LUMBAR SPINE - 2-3 VIEW   COMPARISON:  Reformats from abdominopelvic CT 06/01/2024   FINDINGS: Five non-rib-bearing lumbar vertebra with diminutive ribs at T12. Mild broad-based dextroscoliotic curvature. 6 mm anterolisthesis of L4 on L5. Facet hypertrophy and mild disc space narrowing at this level. No pars defects. Remaining disc spaces are preserved. Vertebral body heights are normal. No evidence of fracture or focal bone abnormality. The iliac joints are normal.   IMPRESSION: 1. Mild broad-based dextroscoliotic curvature. 2. Grade 1 anterolisthesis of L4 on L5 with facet hypertrophy and mild disc space narrowing.     Electronically Signed   By: Andrea Gasman M.D.   On: 06/11/2024 15:17  PAIN:    Pain Intensity: Present: 6/10, Best: 6/10, Worst: 8/10 Pain location: Lower Back and L buttock  Pain Quality: constant, sharp, and burning  Radiating: Yes  Numbness/Tingling: Yes Relieving factors: Prescription Medication History of prior back injury, pain, surgery, or therapy: No  PRECAUTIONS: Fall  WEIGHT BEARING RESTRICTIONS: No  FALLS: Has patient fallen in last 6  months? No  Living Environment Lives with: lives with their family Lives in: House/apartment Stairs: Yes: External: 5 steps; on left going up Has following equipment at home: Ramped entry  Prior level of function: Independent  Occupational demands: Lifting to 50#   Hobbies: Baseball, [employer], Football Dentist)   PATIENT GOALS: Walk and lift without any pain.    OBJECTIVE:  Patient Surveys  Modified Oswestry:  MODIFIED OSWESTRY DISABILITY SCALE  Date: 07/11/2024 Score  Pain intensity 3 =  Pain medication provides me with moderate relief from pain.  2. Personal care (washing, dressing, etc.) 0 =  I can take care of myself normally without causing increased pain.  3. Lifting 3 = Pain prevents me from lifting heavy weights, but I can manage light to medium weights if they are conveniently positioned  4. Walking 3 =  Pain prevents me from walking more than  mile.  5. Sitting 2 =  Pain prevents me from sitting more than 1 hour.  6. Standing 4 =  Pain prevents me from standing more than 10 minutes.  7. Sleeping 3 =  Even when I take pain medication, I sleep less than 4 hours.  8. Social Life 2 = Pain prevents me from participating in more energetic activities (eg. sports, dancing).  9. Traveling 3 = My pain restricts my travel over 1 hour  10. Employment/ Homemaking 4 = Pain prevents me from doing even light duties.  Total 27/50   Interpretation of scores: Score Category Description  0-20% Minimal Disability The patient can cope with most living activities. Usually no treatment is indicated apart from advice on lifting, sitting and exercise  21-40% Moderate Disability The patient experiences more pain and difficulty with sitting, lifting and standing. Travel and social life are more difficult and they may be disabled from work. Personal care, sexual activity and sleeping are not grossly affected, and the patient can usually be managed by conservative means  41-60% Severe  Disability Pain remains the main problem in this group, but activities of daily living are affected. These patients require a detailed investigation  61-80% Crippled Back pain impinges on all aspects of the patient's life. Positive intervention is required  81-100% Bed-bound These patients are either bed-bound  or exaggerating their symptoms  Bluford FORBES Zoe DELENA Karon DELENA, et al. Surgery versus conservative management of stable thoracolumbar fracture: the PRESTO feasibility RCT. [place] ([place]): [employer]; 2021 Nov. [facility] Technology Assessment, No. 25.62.) Appendix 3, Oswestry Disability Index category descriptors. Available from: Findjewelers.cz  Minimally Clinically Important Difference (MCID) = 12.8%  Cognition Patient is oriented to person, place, and time.  Recent memory is intact.    Gross Musculoskeletal Assessment Tremor: None Bulk: Normal Tone: Normal No visible step-off along spinal column, no signs of scoliosis  GAIT: Distance walked: 33m  Assistive device utilized: None Level of assistance: Complete Independence Comments:   Posture: Lumbar lordosis: WNL Iliac crest height: Equal bilaterally Lumbar lateral shift: Negative  AROM AROM (Normal range in degrees) AROM   Lumbar   Flexion (65) 100%  Extension (30) 50%  Right lateral flexion (25) 100%*  Left lateral flexion (25) 100%*  Right rotation (30)   Left rotation (30)       Hip Right Left  Flexion (125)    Extension (15)    Abduction (40)    Adduction     Internal Rotation (45) WNL   External Rotation (45) 30 30      Knee    Flexion (135)    Extension (0)        Ankle    Dorsiflexion (20)    Plantarflexion (50)    Inversion (35)    Eversion (15)    (* = pain; Blank rows = not tested)  LE MMT: MMT (out of 5) Right  Left   Hip flexion 4- 4-  Hip extension    Hip abduction    Hip adduction    Hip internal rotation 4 4  Hip external rotation 4 4   Knee flexion 4- 4-  Knee extension 4- 4-  Ankle dorsiflexion 5 5  Ankle plantarflexion 5 5  Ankle inversion    Ankle eversion    (* = pain; Blank rows = not tested)  Sensation Grossly intact to light touch throughout bilateral LEs as determined by testing dermatomes L2-S2. Proprioception, stereognosis, and hot/cold testing deferred on this date.  Reflexes R/L Knee Jerk (L3/4): 2+/2+   Muscle Length Hamstrings: R:Negative, 20 deg  L: Positive for tightness and pain, 35 deg   Palpation Location Right Left         Lumbar paraspinals  1  Quadratus Lumborum    Gluteus Maximus  1  Gluteus Medius    Deep hip external rotators  2  PSIS    Fortin's Area (SIJ)    Greater Trochanter    (Blank rows = not tested) Graded on 0-4 scale (0 = no pain, 1 = pain, 2 = pain with wincing/grimacing/flinching, 3 = pain with withdrawal, 4 = unwilling to allow palpation)  Passive Accessory  Motion Pt denies reproduction of back pain with CPA L1-L5 and UPA bilaterally L1-L5. Generally, hypomobile throughout  Special Tests Lumbar Radiculopathy and Discogenic: Centralization and Peripheralization (SN 92, -LR 0.12): Not examined SLR (SN 92, -LR 0.29): R: Negative L:  Positive Crossed SLR (SP 90): Not Examined   Lumbar Foraminal Stenosis: Lumbar quadrant (SN 70): Not Tested   Hip: FABER (SN 81): R: Positive for pain and tightness L: Positive for pain and tightness  TODAY'S TREATMENT: DATE: 07/24/24  Subjective: Patient reports 3/10 pain in the lower back. No questions or concerns.   Therapeutic Exercise:   Supine 90/90 - Alternating LE with OH reach    3 x  10 - Weighted Dowel BUE    Supine Anti Rotation with Alternating LE     3 x 10 - Grey TB    Side Plank with TB Around thigh - Bent knee   3 x 10s hold - Red TB   Hip Matrix - Hip Abduction   1 x 10 - 25#   2 x 10 - 40#   Supine hip Flexion against resistance    R/L: 3 x 10 - Red TB around feet     Single Knee To Chest Stretch     30s/bout x 2 in order to improve tissue extensibility    Supine Piriformis Stretch   30s/bout x 2 in order to improve tissue extensibility   Supine Lumbar Rotation Stretch    30s/bout x 2 in order to improve tissue extensibility   Therapeutic Activity:   Lateral Stepping against TB - Blue    2 x 12'   2 x 08-12-2024'     Romanian Dead Lift - Kettle Bell    1 x 10 - 20#   2 x 10 - 30#   PATIENT EDUCATION:  Education details: Exercise Technique Person educated: Patient Education method: Explanation, Demonstration, and Handouts Education comprehension: verbalized understanding, returned demonstration, and needs further education   HOME EXERCISE PROGRAM:  Access Code: [email or web address] URL: [email or web address] Date: 07/24/2024 Prepared by: Lonni Pall  Exercises - Supine Bridge  - 1 x daily - 3-4 x weekly - 3 sets - 10 reps - Clam with Resistance  - 1 x daily - 7 x weekly - 3 sets - 10 reps - Supine Sciatic Nerve Glide  - 1-2 x daily - 7 x weekly - 2-3 sets - 10 reps - Supine Lower Trunk Rotation  - 1-2 x daily - 7 x weekly - 2-3 sets - 10-15 reps - Supine Piriformis Stretch with Foot on Ground  - 1-2 x daily - 7 x weekly - 3 sets - 10 reps - Hooklying Single Knee to Chest  - 1 x daily - 7 x weekly - 3 sets - 10 reps - Side Plank with Clam and Resistance  - 1 x daily - 3-4 x weekly - 3 sets - 10-15s hold - Supine 90/90 leg extensions  - 1 x daily - 3-4 x weekly - 2-3 sets - 10-12 reps - Side Stepping with Resistance at Ankles  - 1 x daily - 3-4 x weekly - 2-3 sets - 10 reps  Access Code: [email or web address] URL: [email or web address] Date: 07/11/2024 Prepared by: Lonni Pall  Exercises - Supine Bridge  - 1 x daily - 3-4 x weekly - 3 sets - 10 reps - Clam with Resistance  - 1 x daily - 7 x weekly - 3 sets - 10 reps - Supine Sciatic Nerve Glide  - 1-2 x daily - 7 x weekly - 2-3 sets - 10 reps - Supine Lower Trunk Rotation  - 1-2 x daily - 7 x weekly - 2-3 sets  - 10-15 reps - Supine Piriformis Stretch with Foot on Ground  - 1-2 x daily - 7 x weekly - 3 sets - 10 reps - Hooklying Single Knee to Chest  - 1 x daily - 7 x weekly - 3 sets - 10 reps   ASSESSMENT:  CLINICAL IMPRESSION: Patient returns to OPPT for first f/u in management of LBP. Time spent reviewing HEP with good return demonstration from patient. He tolerated all progressions to exercies today without pain in the lower back. Good demonstration  of kettle bell RDLs while maintaining neutral spine. Patient still reports intermittent lower back pain limiting prolonged walking.  Patient will still continue to benefit from skilled PT services in order to address current deficits and maximize return to PLOF.     OBJECTIVE IMPAIRMENTS: Abnormal gait, decreased balance, difficulty walking, decreased ROM, decreased strength, and pain.   ACTIVITY LIMITATIONS: carrying, lifting, bending, standing, squatting, stairs, and transfers  PARTICIPATION LIMITATIONS: shopping, community activity, occupation, and yard work  PERSONAL FACTORS: Fitness, Past/current experiences, Profession, Time since onset of injury/illness/exacerbation, and 1 comorbidity: Anterolisthesis of Lumbar Spine  are also affecting patient's functional outcome.   REHAB POTENTIAL: Good  CLINICAL DECISION MAKING: Evolving/moderate complexity  EVALUATION COMPLEXITY: Moderate   GOALS: Goals reviewed with patient? No  SHORT TERM GOALS: Target date: 08/08/2024  Pt will be independent with HEP in order to improve strength and decrease back pain to improve pain-free function at home and work. Baseline:  07/11/24: Initial provided Goal status: INITIAL   LONG TERM GOALS: Target date: 09/05/2024  Pt will be able to squat and lift 40# with proper squatting mechanics in order to demonstrate clinically significant reduction in back pain and good ability to return to work.  Baseline: 07/11/24: not tested Goal status: INITIAL  2.  Pt  will decrease worst back pain by at least 2 points on the NPRS in order to demonstrate clinically significant reduction in back pain. Baseline:  07/11/24: 8/10 NPS  Goal status: INITIAL  3.  Pt will decrease mODI score by at least 13 points in order demonstrate clinically significant reduction in back pain/disability.       Baseline: 07/11/2024: 27/50 = 54% (moderate disability)  Goal status: INITIAL  4.  Pt will achieve 6-8 hours of sleep per night without report of awakening from pain in order to demonstrate significant improvements in pain. Baseline: 07/11/2024: 3-4 hours Goal status: INITIAL  4.  Pt will increase by at least 0.13 m/s in order to demonstrate clinically significant improvement in community ambulation.  Baseline: 07/11/2024: .74 (Self Selected) without AD Goal status: INITIAL  PLAN:  PT FREQUENCY: 1-2x/week  PT DURATION: 8 weeks  PLANNED INTERVENTIONS: 97110-Therapeutic exercises, 97530- Therapeutic activity, 97112- Neuromuscular re-education, 97535- Self Care, [id number]- Manual therapy, [phone]- Gait training, Patient/Family education, Balance training, Joint manipulation, Spinal mobilization, Cryotherapy, and Moist heat  PLAN FOR NEXT SESSION: Review HEP, Progress Gluteal Strengthening, Flexion based core exercises.   Lonni Pall PT, DPT Physical Therapist- [place]  07/24/2024, 4:02 PM

## 2024-07-26 ENCOUNTER — Ambulatory Visit

## 2024-07-26 ENCOUNTER — Telehealth: Payer: Self-pay

## 2024-07-26 NOTE — Therapy (Incomplete)
 OUTPATIENT PHYSICAL THERAPY THORACOLUMBAR TREATMENT   Patient Name: Vincent Wright MRN: [id number] DOB:1971/06/12, 53 y.o., male Today's Date: 07/26/2024  END OF SESSION:     Past Medical History:  Diagnosis Date   Glaucoma 10/2017   No past surgical history on file. Patient Active Problem List   Diagnosis Date Noted   Acute left-sided low back pain with left-sided sciatica 06/11/2024   Muscle spasm 06/11/2024   Anterolisthesis of lumbar spine 06/11/2024    PCP: Alvia Corean LITTIE, FNP  REFERRING PROVIDER: Alvia Corean LITTIE, *  REFERRING DIAG:  M43.16 (ICD-10-CM) - Anterolisthesis of lumbar spine  M54.42 (ICD-10-CM) - Acute left-sided low back pain with left-sided sciatica  M62.838 (ICD-10-CM) - Muscle spasm    RATIONALE FOR EVALUATION AND TREATMENT: Rehabilitation  THERAPY DIAG: Other low back pain  Sciatica, left side  Muscle weakness (generalized)  Other abnormalities of gait and mobility  ONSET DATE: 06/01/2024.  FOLLOW-UP APPT SCHEDULED WITH REFERRING PROVIDER: Didn't address     SUBJECTIVE:                                                                                                                                                                                         SUBJECTIVE STATEMENT:    Patient is a 53 year old male presenting to OPPT with a chief concern of Lower back Pain.   PERTINENT HISTORY:   Vincent Wright is a 53 year old male presenting with lower back pain. Patient reports initial onset occurred while turning and transitioning from bed. Sharp pain was noted and he reported to the ED on 06/01/2024 due to pain intensity. Patient reports that the pain starts in the lower back and radiates into the L buttock and posterior thigh. Pain has minimally improved since initial onset with mitigation from prescription medications.   The patient's pain is aggravated with prolonged walking, standing, sitting. Pain alleviated with heat and pain  medications. Patient reports that the pain affects his sleep cycle and he is able to sleep 3-4 hours a night. Patient's pain has limited from performing work related tasks. 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   Dominant hand: right  Red flags: Negative for bowel/bladder changes, saddle paresthesia, direct trauma to lower back   Imaging: Yes   CLINICAL DATA:  Left-sided sciatica.  Low back pain since 06/01/2024   EXAM: LUMBAR SPINE - 2-3 VIEW   COMPARISON:  Reformats from abdominopelvic CT 06/01/2024   FINDINGS: Five non-rib-bearing lumbar vertebra with diminutive ribs at T12. Mild broad-based dextroscoliotic curvature. 6 mm anterolisthesis of L4 on L5. Facet hypertrophy and mild  disc space narrowing at this level. No pars defects. Remaining disc spaces are preserved. Vertebral body heights are normal. No evidence of fracture or focal bone abnormality. The iliac joints are normal.   IMPRESSION: 1. Mild broad-based dextroscoliotic curvature. 2. Grade 1 anterolisthesis of L4 on L5 with facet hypertrophy and mild disc space narrowing.     Electronically Signed   By: Andrea Gasman M.D.   On: 06/11/2024 15:17  PAIN:    Pain Intensity: Present: 6/10, Best: 6/10, Worst: 8/10 Pain location: Lower Back and L buttock  Pain Quality: constant, sharp, and burning  Radiating: Yes  Numbness/Tingling: Yes Relieving factors: Prescription Medication History of prior back injury, pain, surgery, or therapy: No  PRECAUTIONS: Fall  WEIGHT BEARING RESTRICTIONS: No  FALLS: Has patient fallen in last 6 months? No  Living Environment Lives with: lives with their family Lives in: House/apartment Stairs: Yes: External: 5 steps; on left going up Has following equipment at home: Ramped entry  Prior level of function: Independent  Occupational demands: Lifting to 50#   Hobbies: Baseball, [employer], Football Dentist)    PATIENT GOALS: Walk and lift without any pain.    OBJECTIVE:  Patient Surveys  Modified Oswestry:  MODIFIED OSWESTRY DISABILITY SCALE  Date: 07/11/2024 Score  Pain intensity 3 =  Pain medication provides me with moderate relief from pain.  2. Personal care (washing, dressing, etc.) 0 =  I can take care of myself normally without causing increased pain.  3. Lifting 3 = Pain prevents me from lifting heavy weights, but I can manage light to medium weights if they are conveniently positioned  4. Walking 3 =  Pain prevents me from walking more than  mile.  5. Sitting 2 =  Pain prevents me from sitting more than 1 hour.  6. Standing 4 =  Pain prevents me from standing more than 10 minutes.  7. Sleeping 3 =  Even when I take pain medication, I sleep less than 4 hours.  8. Social Life 2 = Pain prevents me from participating in more energetic activities (eg. sports, dancing).  9. Traveling 3 = My pain restricts my travel over 1 hour  10. Employment/ Homemaking 4 = Pain prevents me from doing even light duties.  Total 27/50   Interpretation of scores: Score Category Description  0-20% Minimal Disability The patient can cope with most living activities. Usually no treatment is indicated apart from advice on lifting, sitting and exercise  21-40% Moderate Disability The patient experiences more pain and difficulty with sitting, lifting and standing. Travel and social life are more difficult and they may be disabled from work. Personal care, sexual activity and sleeping are not grossly affected, and the patient can usually be managed by conservative means  41-60% Severe Disability Pain remains the main problem in this group, but activities of daily living are affected. These patients require a detailed investigation  61-80% Crippled Back pain impinges on all aspects of the patient's life. Positive intervention is required  81-100% Bed-bound These patients are either bed-bound or exaggerating their  symptoms  Bluford FORBES Zoe DELENA Karon DELENA, et al. Surgery versus conservative management of stable thoracolumbar fracture: the PRESTO feasibility RCT. [place] ([place]): [employer]; 2021 Nov. [facility] Technology Assessment, No. 25.62.) Appendix 3, Oswestry Disability Index category descriptors. Available from: Findjewelers.cz  Minimally Clinically Important Difference (MCID) = 12.8%  Cognition Patient is oriented to person, place, and time.  Recent memory is intact.    Gross Musculoskeletal Assessment Tremor: None  Bulk: Normal Tone: Normal No visible step-off along spinal column, no signs of scoliosis  GAIT: Distance walked: 27m  Assistive device utilized: None Level of assistance: Complete Independence Comments:   Posture: Lumbar lordosis: WNL Iliac crest height: Equal bilaterally Lumbar lateral shift: Negative  AROM AROM (Normal range in degrees) AROM   Lumbar   Flexion (65) 100%  Extension (30) 50%  Right lateral flexion (25) 100%*  Left lateral flexion (25) 100%*  Right rotation (30)   Left rotation (30)       Hip Right Left  Flexion (125)    Extension (15)    Abduction (40)    Adduction     Internal Rotation (45) WNL   External Rotation (45) 30 30      Knee    Flexion (135)    Extension (0)        Ankle    Dorsiflexion (20)    Plantarflexion (50)    Inversion (35)    Eversion (15)    (* = pain; Blank rows = not tested)  LE MMT: MMT (out of 5) Right  Left   Hip flexion 4- 4-  Hip extension    Hip abduction    Hip adduction    Hip internal rotation 4 4  Hip external rotation 4 4  Knee flexion 4- 4-  Knee extension 4- 4-  Ankle dorsiflexion 5 5  Ankle plantarflexion 5 5  Ankle inversion    Ankle eversion    (* = pain; Blank rows = not tested)  Sensation Grossly intact to light touch throughout bilateral LEs as determined by testing dermatomes L2-S2. Proprioception, stereognosis, and hot/cold testing  deferred on this date.  Reflexes R/L Knee Jerk (L3/4): 2+/2+   Muscle Length Hamstrings: R:Negative, 20 deg  L: Positive for tightness and pain, 35 deg   Palpation Location Right Left         Lumbar paraspinals  1  Quadratus Lumborum    Gluteus Maximus  1  Gluteus Medius    Deep hip external rotators  2  PSIS    Fortin's Area (SIJ)    Greater Trochanter    (Blank rows = not tested) Graded on 0-4 scale (0 = no pain, 1 = pain, 2 = pain with wincing/grimacing/flinching, 3 = pain with withdrawal, 4 = unwilling to allow palpation)  Passive Accessory  Motion Pt denies reproduction of back pain with CPA L1-L5 and UPA bilaterally L1-L5. Generally, hypomobile throughout  Special Tests Lumbar Radiculopathy and Discogenic: Centralization and Peripheralization (SN 92, -LR 0.12): Not examined SLR (SN 92, -LR 0.29): R: Negative L:  Positive Crossed SLR (SP 90): Not Examined   Lumbar Foraminal Stenosis: Lumbar quadrant (SN 70): Not Tested   Hip: FABER (SN 81): R: Positive for pain and tightness L: Positive for pain and tightness  TODAY'S TREATMENT: DATE: 07/26/24  Subjective: ***. No questions or concerns.   Therapeutic Exercise:   Supine 90/90 - Alternating LE with OH reach    3 x 10 - Weighted Dowel BUE    Supine Anti Rotation with Alternating LE     3 x 10 - Grey TB    Side Plank with TB Around thigh - Bent knee   3 x 10s hold - Red TB   Hip Matrix - Hip Abduction   1 x 10 - 25#   2 x 10 - 40#   Supine hip Flexion against resistance    R/L: 3 x 10 - Red TB around feet  Single Knee To Chest Stretch    30s/bout x 2 in order to improve tissue extensibility    Supine Piriformis Stretch   30s/bout x 2 in order to improve tissue extensibility   Supine Lumbar Rotation Stretch    30s/bout x 2 in order to improve tissue extensibility   Therapeutic Activity:   Lateral Stepping against TB - Blue    2 x 12'   2 x 2024/08/05'     Romanian Dead Lift - Kettle Bell    1 x  10 - 20#   2 x 10 - 30#   PATIENT EDUCATION:  Education details: Exercise Technique Person educated: Patient Education method: Explanation, Demonstration, and Handouts Education comprehension: verbalized understanding, returned demonstration, and needs further education   HOME EXERCISE PROGRAM:  Access Code: [email or web address] URL: [email or web address] Date: 07/24/2024 Prepared by: Lonni Pall  Exercises - Supine Bridge  - 1 x daily - 3-4 x weekly - 3 sets - 10 reps - Clam with Resistance  - 1 x daily - 7 x weekly - 3 sets - 10 reps - Supine Sciatic Nerve Glide  - 1-2 x daily - 7 x weekly - 2-3 sets - 10 reps - Supine Lower Trunk Rotation  - 1-2 x daily - 7 x weekly - 2-3 sets - 10-15 reps - Supine Piriformis Stretch with Foot on Ground  - 1-2 x daily - 7 x weekly - 3 sets - 10 reps - Hooklying Single Knee to Chest  - 1 x daily - 7 x weekly - 3 sets - 10 reps - Side Plank with Clam and Resistance  - 1 x daily - 3-4 x weekly - 3 sets - 10-15s hold - Supine 90/90 leg extensions  - 1 x daily - 3-4 x weekly - 2-3 sets - 10-12 reps - Side Stepping with Resistance at Ankles  - 1 x daily - 3-4 x weekly - 2-3 sets - 10 reps  Access Code: [email or web address] URL: [email or web address] Date: 07/11/2024 Prepared by: Lonni Pall  Exercises - Supine Bridge  - 1 x daily - 3-4 x weekly - 3 sets - 10 reps - Clam with Resistance  - 1 x daily - 7 x weekly - 3 sets - 10 reps - Supine Sciatic Nerve Glide  - 1-2 x daily - 7 x weekly - 2-3 sets - 10 reps - Supine Lower Trunk Rotation  - 1-2 x daily - 7 x weekly - 2-3 sets - 10-15 reps - Supine Piriformis Stretch with Foot on Ground  - 1-2 x daily - 7 x weekly - 3 sets - 10 reps - Hooklying Single Knee to Chest  - 1 x daily - 7 x weekly - 3 sets - 10 reps   ASSESSMENT:  CLINICAL IMPRESSION: Continued PT POC in management of LBP and increasing LE strength. PT focused on continued core stabilization with extremity movements in  supine. Continued gluteal strengthening with kettlebell squats and resistance against lateral hip musculature. ***.   Patient still has intermittent pain in his back that limits functional tasks such as prolonged walking, standing, and recreational activities. Patient will still continue to benefit from skilled PT services in order to address current deficits and maximize return to PLOF.    OBJECTIVE IMPAIRMENTS: Abnormal gait, decreased balance, difficulty walking, decreased ROM, decreased strength, and pain.   ACTIVITY LIMITATIONS: carrying, lifting, bending, standing, squatting, stairs, and transfers  PARTICIPATION LIMITATIONS: shopping, community activity, occupation, and yard work  PERSONAL FACTORS: Fitness, Past/current experiences, Profession, Time  since onset of injury/illness/exacerbation, and 1 comorbidity: Anterolisthesis of Lumbar Spine  are also affecting patient's functional outcome.   REHAB POTENTIAL: Good  CLINICAL DECISION MAKING: Evolving/moderate complexity  EVALUATION COMPLEXITY: Moderate   GOALS: Goals reviewed with patient? No  SHORT TERM GOALS: Target date: 08/08/2024  Pt will be independent with HEP in order to improve strength and decrease back pain to improve pain-free function at home and work. Baseline:  07/11/24: Initial provided Goal status: INITIAL   LONG TERM GOALS: Target date: 09/05/2024  Pt will be able to squat and lift 40# with proper squatting mechanics in order to demonstrate clinically significant reduction in back pain and good ability to return to work.  Baseline: 07/11/24: not tested Goal status: INITIAL  2.  Pt will decrease worst back pain by at least 2 points on the NPRS in order to demonstrate clinically significant reduction in back pain. Baseline:  07/11/24: 8/10 NPS  Goal status: INITIAL  3.  Pt will decrease mODI score by at least 13 points in order demonstrate clinically significant reduction in back pain/disability.        Baseline: 07/11/2024: 27/50 = 54% (moderate disability)  Goal status: INITIAL  4.  Pt will achieve 6-8 hours of sleep per night without report of awakening from pain in order to demonstrate significant improvements in pain. Baseline: 07/11/2024: 3-4 hours Goal status: INITIAL  4.  Pt will increase by at least 0.13 m/s in order to demonstrate clinically significant improvement in community ambulation.  Baseline: 07/11/2024: .74 (Self Selected) without AD Goal status: INITIAL  PLAN:  PT FREQUENCY: 1-2x/week  PT DURATION: 8 weeks  PLANNED INTERVENTIONS: 97110-Therapeutic exercises, 97530- Therapeutic activity, 97112- Neuromuscular re-education, 97535- Self Care, [id number]- Manual therapy, [phone]- Gait training, Patient/Family education, Balance training, Joint manipulation, Spinal mobilization, Cryotherapy, and Moist heat  PLAN FOR NEXT SESSION: Review HEP, Progress Gluteal Strengthening, Flexion based core exercises.   Lonni Pall PT, DPT Physical Therapist- [place]  07/26/2024, 11:02 AM

## 2024-07-26 NOTE — Telephone Encounter (Signed)
 Called patient about missed appointment. Notified him about about no show policy. Patient agreeable about next appt and without questions at end of phone call.   Lonni Pall PT, DPT Physical Therapist- [place]

## 2024-07-27 ENCOUNTER — Ambulatory Visit: Admitting: Family Medicine

## 2024-07-30 ENCOUNTER — Ambulatory Visit

## 2024-08-02 ENCOUNTER — Ambulatory Visit

## 2024-08-06 ENCOUNTER — Ambulatory Visit

## 2024-08-06 ENCOUNTER — Encounter: Payer: Self-pay | Admitting: Family Medicine

## 2024-08-06 ENCOUNTER — Ambulatory Visit: Admitting: Family Medicine

## 2024-08-06 VITALS — BP 118/72 | HR 80 | Temp 98.1°F | Ht 77.0 in | Wt 195.4 lb

## 2024-08-06 DIAGNOSIS — R2689 Other abnormalities of gait and mobility: Secondary | ICD-10-CM

## 2024-08-06 DIAGNOSIS — M4316 Spondylolisthesis, lumbar region: Secondary | ICD-10-CM | POA: Diagnosis not present

## 2024-08-06 DIAGNOSIS — M6281 Muscle weakness (generalized): Secondary | ICD-10-CM

## 2024-08-06 DIAGNOSIS — M5417 Radiculopathy, lumbosacral region: Secondary | ICD-10-CM

## 2024-08-06 DIAGNOSIS — M5432 Sciatica, left side: Secondary | ICD-10-CM

## 2024-08-06 DIAGNOSIS — M5459 Other low back pain: Secondary | ICD-10-CM | POA: Diagnosis not present

## 2024-08-06 NOTE — Therapy (Signed)
 OUTPATIENT PHYSICAL THERAPY THORACOLUMBAR TREATMENT   Patient Name: Vincent Wright MRN: [id number] DOB:1971-01-17, 53 y.o., male Today's Date: 08/06/2024  END OF SESSION:  PT End of Session - 08/06/24 1817     Visit Number 3    Number of Visits 17    Date for Recertification  09/05/24    PT Start Time 1815    PT Stop Time 1855    PT Time Calculation (min) 40 min    Activity Tolerance Patient tolerated treatment well    Behavior During Therapy [facility] for tasks assessed/performed            Past Medical History:  Diagnosis Date   Glaucoma 10/2017   History reviewed. No pertinent surgical history. Patient Active Problem List   Diagnosis Date Noted   Acute left-sided low back pain with left-sided sciatica 06/11/2024   Muscle spasm 06/11/2024   Anterolisthesis of lumbar spine 06/11/2024    PCP: Alvia Corean LITTIE, FNP  REFERRING PROVIDER: Alvia Corean LITTIE, *  REFERRING DIAG:  M43.16 (ICD-10-CM) - Anterolisthesis of lumbar spine  M54.42 (ICD-10-CM) - Acute left-sided low back pain with left-sided sciatica  M62.838 (ICD-10-CM) - Muscle spasm    RATIONALE FOR EVALUATION AND TREATMENT: Rehabilitation  THERAPY DIAG: Other low back pain  Sciatica, left side  Muscle weakness (generalized)  Other abnormalities of gait and mobility  ONSET DATE: 06/01/2024.  FOLLOW-UP APPT SCHEDULED WITH REFERRING PROVIDER: Didn't address     SUBJECTIVE:                                                                                                                                                                                         SUBJECTIVE STATEMENT:    Patient is a 53 year old male presenting to OPPT with a chief concern of Lower back Pain.   PERTINENT HISTORY:   Vincent Wright is a 53 year old male presenting with lower back pain. Patient reports initial onset occurred while turning and transitioning from bed. Sharp pain was noted and he reported to the ED on 06/01/2024  due to pain intensity. Patient reports that the pain starts in the lower back and radiates into the L buttock and posterior thigh. Pain has minimally improved since initial onset with mitigation from prescription medications.   The patient's pain is aggravated with prolonged walking, standing, sitting. Pain alleviated with heat and pain medications. Patient reports that the pain affects his sleep cycle and he is able to sleep 3-4 hours a night. Patient's pain has limited from performing work related tasks. 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  Dominant hand: right  Red flags: Negative for bowel/bladder changes, saddle paresthesia, direct trauma to lower back   Imaging: Yes   CLINICAL DATA:  Left-sided sciatica.  Low back pain since 06/01/2024   EXAM: LUMBAR SPINE - 2-3 VIEW   COMPARISON:  Reformats from abdominopelvic CT 06/01/2024   FINDINGS: Five non-rib-bearing lumbar vertebra with diminutive ribs at T12. Mild broad-based dextroscoliotic curvature. 6 mm anterolisthesis of L4 on L5. Facet hypertrophy and mild disc space narrowing at this level. No pars defects. Remaining disc spaces are preserved. Vertebral body heights are normal. No evidence of fracture or focal bone abnormality. The iliac joints are normal.   IMPRESSION: 1. Mild broad-based dextroscoliotic curvature. 2. Grade 1 anterolisthesis of L4 on L5 with facet hypertrophy and mild disc space narrowing.     Electronically Signed   By: Andrea Gasman M.D.   On: 06/11/2024 15:17  PAIN:    Pain Intensity: Present: 6/10, Best: 6/10, Worst: 8/10 Pain location: Lower Back and L buttock  Pain Quality: constant, sharp, and burning  Radiating: Yes  Numbness/Tingling: Yes Relieving factors: Prescription Medication History of prior back injury, pain, surgery, or therapy: No  PRECAUTIONS: Fall  WEIGHT BEARING RESTRICTIONS: No  FALLS: Has patient fallen in  last 6 months? No  Living Environment Lives with: lives with their family Lives in: House/apartment Stairs: Yes: External: 5 steps; on left going up Has following equipment at home: Ramped entry  Prior level of function: Independent  Occupational demands: Lifting to 50#   Hobbies: Baseball, [employer], Football Dentist)   PATIENT GOALS: Walk and lift without any pain.    OBJECTIVE:  Patient Surveys  Modified Oswestry:  MODIFIED OSWESTRY DISABILITY SCALE  Date: 07/11/2024 Score  Pain intensity 3 =  Pain medication provides me with moderate relief from pain.  2. Personal care (washing, dressing, etc.) 0 =  I can take care of myself normally without causing increased pain.  3. Lifting 3 = Pain prevents me from lifting heavy weights, but I can manage light to medium weights if they are conveniently positioned  4. Walking 3 =  Pain prevents me from walking more than  mile.  5. Sitting 2 =  Pain prevents me from sitting more than 1 hour.  6. Standing 4 =  Pain prevents me from standing more than 10 minutes.  7. Sleeping 3 =  Even when I take pain medication, I sleep less than 4 hours.  8. Social Life 2 = Pain prevents me from participating in more energetic activities (eg. sports, dancing).  9. Traveling 3 = My pain restricts my travel over 1 hour  10. Employment/ Homemaking 4 = Pain prevents me from doing even light duties.  Total 27/50   Interpretation of scores: Score Category Description  0-20% Minimal Disability The patient can cope with most living activities. Usually no treatment is indicated apart from advice on lifting, sitting and exercise  21-40% Moderate Disability The patient experiences more pain and difficulty with sitting, lifting and standing. Travel and social life are more difficult and they may be disabled from work. Personal care, sexual activity and sleeping are not grossly affected, and the patient can usually be managed by conservative means  41-60% Severe  Disability Pain remains the main problem in this group, but activities of daily living are affected. These patients require a detailed investigation  61-80% Crippled Back pain impinges on all aspects of the patient's life. Positive intervention is required  81-100% Bed-bound These patients are either bed-bound  or exaggerating their symptoms  Bluford FORBES Zoe DELENA Karon DELENA, et al. Surgery versus conservative management of stable thoracolumbar fracture: the PRESTO feasibility RCT. [place] ([place]): [employer]; 2021 Nov. [facility] Technology Assessment, No. 25.62.) Appendix 3, Oswestry Disability Index category descriptors. Available from: Findjewelers.cz  Minimally Clinically Important Difference (MCID) = 12.8%  Cognition Patient is oriented to person, place, and time.  Recent memory is intact.    Gross Musculoskeletal Assessment Tremor: None Bulk: Normal Tone: Normal No visible step-off along spinal column, no signs of scoliosis  GAIT: Distance walked: 70m  Assistive device utilized: None Level of assistance: Complete Independence Comments:   Posture: Lumbar lordosis: WNL Iliac crest height: Equal bilaterally Lumbar lateral shift: Negative  AROM AROM (Normal range in degrees) AROM   Lumbar   Flexion (65) 100%  Extension (30) 50%  Right lateral flexion (25) 100%*  Left lateral flexion (25) 100%*  Right rotation (30)   Left rotation (30)       Hip Right Left  Flexion (125)    Extension (15)    Abduction (40)    Adduction     Internal Rotation (45) WNL   External Rotation (45) 30 30      Knee    Flexion (135)    Extension (0)        Ankle    Dorsiflexion (20)    Plantarflexion (50)    Inversion (35)    Eversion (15)    (* = pain; Blank rows = not tested)  LE MMT: MMT (out of 5) Right  Left   Hip flexion 4- 4-  Hip extension    Hip abduction    Hip adduction    Hip internal rotation 4 4  Hip external rotation 4 4   Knee flexion 4- 4-  Knee extension 4- 4-  Ankle dorsiflexion 5 5  Ankle plantarflexion 5 5  Ankle inversion    Ankle eversion    (* = pain; Blank rows = not tested)  Sensation Grossly intact to light touch throughout bilateral LEs as determined by testing dermatomes L2-S2. Proprioception, stereognosis, and hot/cold testing deferred on this date.  Reflexes R/L Knee Jerk (L3/4): 2+/2+   Muscle Length Hamstrings: R:Negative, 20 deg  L: Positive for tightness and pain, 35 deg   Palpation Location Right Left         Lumbar paraspinals  1  Quadratus Lumborum    Gluteus Maximus  1  Gluteus Medius    Deep hip external rotators  2  PSIS    Fortin's Area (SIJ)    Greater Trochanter    (Blank rows = not tested) Graded on 0-4 scale (0 = no pain, 1 = pain, 2 = pain with wincing/grimacing/flinching, 3 = pain with withdrawal, 4 = unwilling to allow palpation)  Passive Accessory  Motion Pt denies reproduction of back pain with CPA L1-L5 and UPA bilaterally L1-L5. Generally, hypomobile throughout  Special Tests Lumbar Radiculopathy and Discogenic: Centralization and Peripheralization (SN 92, -LR 0.12): Not examined SLR (SN 92, -LR 0.29): R: Negative L:  Positive Crossed SLR (SP 90): Not Examined   Lumbar Foraminal Stenosis: Lumbar quadrant (SN 70): Not Tested   Hip: FABER (SN 81): R: Positive for pain and tightness L: Positive for pain and tightness  TODAY'S TREATMENT: DATE: 08/06/24  Subjective: Patient reports no pain in the lower back. Persistent stiffness in the R lower back but mitigated with stretches and HEP exercises.  No questions or concerns.   Therapeutic Exercise:  Supine 90/90 with med ball reach    3 x 10 - 3 Kg Med Ball  Supine Hip Flexion marches with Red TB around feet    3 x 10 - with 3 Kg MB chest press   Sidelying Clamshell    R: 2 x 10 - Blue TB around thigh   Side Plank with Clam and Resistance   6 set x 10s hold - Red TB around thigh   6 set x  10s hold - Red TB around thigh     Single Knee To Chest Stretch    R: 30s/bout x 2 in order to improve tissue extensibility    Supine Piriformis Stretch   R: 30s/bout x 2 in order to improve tissue extensibility   Supine Lumbar Rotation Stretch    R: 30s/bout x 2 in order to improve tissue extensibility   Therapeutic Activity:   NuStep L6-2 x 5 min x UE/LE (Seat 16) for LE endurance, strength and torso rotation; PT manually adjusted resistance within patient's tolerance.   Kettle Bell Squat  1 x 10 - 10#   2 x 10 - 30#    Lateral Stepping against TB    4 x 15' - Blue TB above ankles  4 x 15' - Blue TB above ankles    Suitcase Carry (30# KB in RUE)  2 x 15'   2 x 15'   Barbell Torso Twist  3 x 20 - 45# Barbell      PATIENT EDUCATION:  Education details: Exercise Technique Person educated: Patient Education method: Explanation, Demonstration, and Handouts Education comprehension: verbalized understanding, returned demonstration, and needs further education   HOME EXERCISE PROGRAM:  Access Code: [email or web address] URL: [email or web address] Date: 07/24/2024 Prepared by: Lonni Pall  Exercises - Supine Bridge  - 1 x daily - 3-4 x weekly - 3 sets - 10 reps - Clam with Resistance  - 1 x daily - 7 x weekly - 3 sets - 10 reps - Supine Sciatic Nerve Glide  - 1-2 x daily - 7 x weekly - 2-3 sets - 10 reps - Supine Lower Trunk Rotation  - 1-2 x daily - 7 x weekly - 2-3 sets - 10-15 reps - Supine Piriformis Stretch with Foot on Ground  - 1-2 x daily - 7 x weekly - 3 sets - 10 reps - Hooklying Single Knee to Chest  - 1 x daily - 7 x weekly - 3 sets - 10 reps - Side Plank with Clam and Resistance  - 1 x daily - 3-4 x weekly - 3 sets - 10-15s hold - Supine 90/90 leg extensions  - 1 x daily - 3-4 x weekly - 2-3 sets - 10-12 reps - Side Stepping with Resistance at Ankles  - 1 x daily - 3-4 x weekly - 2-3 sets - 10 reps  Access Code: [email or web address] URL:  [email or web address] Date: 07/11/2024 Prepared by: Lonni Pall  Exercises - Supine Bridge  - 1 x daily - 3-4 x weekly - 3 sets - 10 reps - Clam with Resistance  - 1 x daily - 7 x weekly - 3 sets - 10 reps - Supine Sciatic Nerve Glide  - 1-2 x daily - 7 x weekly - 2-3 sets - 10 reps - Supine Lower Trunk Rotation  - 1-2 x daily - 7 x weekly - 2-3 sets - 10-15 reps - Supine Piriformis Stretch with Foot on Ground  - 1-2 x daily - 7 x weekly - 3 sets -  10 reps - Hooklying Single Knee to Chest  - 1 x daily - 7 x weekly - 3 sets - 10 reps   ASSESSMENT:  CLINICAL IMPRESSION: Continued PT POC in management of LBP and increasing LE strength. PT focused on continued core stabilization with extremity movements in supine. Continued gluteal strengthening with kettlebell squats and resistance against lateral hip musculature. Good demonstration on maintaining TA activation while walking with kettlebell. Patient's pain has now moved to the R hip but can be mitigated with gluteal stretches/exercises. Patient still has intermittent pain in his back that limits functional tasks such as prolonged walking, standing, and recreational activities. Patient will still continue to benefit from skilled PT services in order to address current deficits and maximize return to PLOF.    OBJECTIVE IMPAIRMENTS: Abnormal gait, decreased balance, difficulty walking, decreased ROM, decreased strength, and pain.   ACTIVITY LIMITATIONS: carrying, lifting, bending, standing, squatting, stairs, and transfers  PARTICIPATION LIMITATIONS: shopping, community activity, occupation, and yard work  PERSONAL FACTORS: Fitness, Past/current experiences, Profession, Time since onset of injury/illness/exacerbation, and 1 comorbidity: Anterolisthesis of Lumbar Spine  are also affecting patient's functional outcome.   REHAB POTENTIAL: Good  CLINICAL DECISION MAKING: Evolving/moderate complexity  EVALUATION COMPLEXITY:  Moderate   GOALS: Goals reviewed with patient? No  SHORT TERM GOALS: Target date: 08/08/2024  Pt will be independent with HEP in order to improve strength and decrease back pain to improve pain-free function at home and work. Baseline:  07/11/24: Initial provided Goal status: INITIAL   LONG TERM GOALS: Target date: 09/05/2024  Pt will be able to squat and lift 40# with proper squatting mechanics in order to demonstrate clinically significant reduction in back pain and good ability to return to work.  Baseline: 07/11/24: not tested Goal status: INITIAL  2.  Pt will decrease worst back pain by at least 2 points on the NPRS in order to demonstrate clinically significant reduction in back pain. Baseline:  07/11/24: 8/10 NPS  Goal status: INITIAL  3.  Pt will decrease mODI score by at least 13 points in order demonstrate clinically significant reduction in back pain/disability.       Baseline: 07/11/2024: 27/50 = 54% (moderate disability)  Goal status: INITIAL  4.  Pt will achieve 6-8 hours of sleep per night without report of awakening from pain in order to demonstrate significant improvements in pain. Baseline: 07/11/2024: 3-4 hours Goal status: INITIAL  4.  Pt will increase by at least 0.13 m/s in order to demonstrate clinically significant improvement in community ambulation.  Baseline: 07/11/2024: .74 (Self Selected) without AD Goal status: INITIAL  PLAN:  PT FREQUENCY: 1-2x/week  PT DURATION: 8 weeks  PLANNED INTERVENTIONS: 97110-Therapeutic exercises, 97530- Therapeutic activity, 97112- Neuromuscular re-education, 97535- Self Care, 02859- Manual therapy, [id number]- Gait training, Patient/Family education, Balance training, Joint manipulation, Spinal mobilization, Cryotherapy, and Moist heat  PLAN FOR NEXT SESSION: Review HEP, Progress Gluteal Strengthening, Flexion based core exercises.   Lonni Pall PT, DPT Physical Therapist- [place]  08/06/2024, 6:18  PM

## 2024-08-06 NOTE — Progress Notes (Signed)
   Acute Office Visit  Subjective:     Patient ID: Vincent Wright, male    DOB: February 22, 1971, 53 y.o.   MRN: [id number]  Chief Complaint  Patient presents with   Follow-up    HPI  Discussed the use of AI scribe software for clinical note transcription with the patient, who gave verbal consent to proceed.  History of Present Illness Vincent Wright is a 53 year old male who presents with tightness and pain when standing for prolonged periods.  Musculoskeletal pain and tightness - Tightness and pain occur when standing for prolonged periods. - Physical therapy is ongoing and improving symptoms. - No specific medications are being taken for this issue.     ROS Per HPI      Objective:    BP 118/72 (BP Location: Left Arm, Patient Position: Sitting)   Pulse 80   Temp 98.1 F (36.7 C) (Temporal)   Ht 6' 5 (1.956 m)   Wt 195 lb 6.4 oz (88.6 kg)   SpO2 96%   BMI 23.17 kg/m    Physical Exam Vitals and nursing note reviewed.  Constitutional:      General: He is not in acute distress.    Appearance: Normal appearance.  HENT:     Head: Normocephalic and atraumatic.     Right Ear: External ear normal.     Left Ear: External ear normal.     Nose: Nose normal.     Mouth/Throat:     Mouth: Mucous membranes are moist.     Pharynx: Oropharynx is clear.  Eyes:     Extraocular Movements: Extraocular movements intact.  Cardiovascular:     Rate and Rhythm: Normal rate and regular rhythm.     Pulses: Normal pulses.     Heart sounds: Normal heart sounds.  Pulmonary:     Effort: Pulmonary effort is normal. No respiratory distress.     Breath sounds: Normal breath sounds. No wheezing, rhonchi or rales.  Musculoskeletal:        General: No swelling or tenderness. Normal range of motion.     Cervical back: Normal range of motion.     Right lower leg: No edema.     Left lower leg: No edema.  Lymphadenopathy:     Cervical: No cervical adenopathy.  Skin:    General: Skin is warm  and dry.  Neurological:     General: No focal deficit present.     Mental Status: He is alert and oriented to person, place, and time.  Psychiatric:        Mood and Affect: Mood normal.        Behavior: Behavior normal.     No results found for any visits on 08/06/24.      Assessment & Plan:   Assessment and Plan Assessment & Plan Lumbar anterolisthesis with lumbosacral radiculopathy, muscle spasm, and left low back pain with sciatica Lumbar anterolisthesis with lumbosacral radiculopathy, muscle spasm, and left low back pain with sciatica. No acute exacerbation.  - Return to work with no restrictions today - Continue PT - Continue home stretching exercises - Continue heat to the area as needed - F/u with me in 6 mos for physical, sooner if needed     No orders of the defined types were placed in this encounter.    No orders of the defined types were placed in this encounter.   Return in about 6 months (around 02/03/2025) for cpe.  Vincent LITTIE Ku, FNP

## 2024-08-06 NOTE — Patient Instructions (Addendum)
 May return to work 08/13/24.  Continue with physical therapy.   Will send in return to work paperwork.  Follow up in about 6 months for physical.

## 2024-08-08 ENCOUNTER — Ambulatory Visit

## 2024-08-08 DIAGNOSIS — R2689 Other abnormalities of gait and mobility: Secondary | ICD-10-CM

## 2024-08-08 DIAGNOSIS — M5459 Other low back pain: Secondary | ICD-10-CM | POA: Diagnosis not present

## 2024-08-08 DIAGNOSIS — M6281 Muscle weakness (generalized): Secondary | ICD-10-CM

## 2024-08-08 DIAGNOSIS — M5432 Sciatica, left side: Secondary | ICD-10-CM

## 2024-08-08 NOTE — Therapy (Signed)
 OUTPATIENT PHYSICAL THERAPY THORACOLUMBAR TREATMENT   Patient Name: Vincent Wright MRN: [id number] DOB:06/05/1971, 53 y.o., male Today's Date: 08/08/2024  END OF SESSION:  PT End of Session - 08/08/24 1734     Visit Number 4    Number of Visits 17    Date for Recertification  09/05/24    PT Start Time 1732    PT Stop Time 1815    PT Time Calculation (min) 43 min    Activity Tolerance Patient tolerated treatment well    Behavior During Therapy [facility] for tasks assessed/performed             Past Medical History:  Diagnosis Date   Glaucoma 10/2017   History reviewed. No pertinent surgical history. Patient Active Problem List   Diagnosis Date Noted   Acute left-sided low back pain with left-sided sciatica 06/11/2024   Muscle spasm 06/11/2024   Anterolisthesis of lumbar spine 06/11/2024    PCP: Alvia Corean LITTIE, FNP  REFERRING PROVIDER: Alvia Corean LITTIE, *  REFERRING DIAG:  M43.16 (ICD-10-CM) - Anterolisthesis of lumbar spine  M54.42 (ICD-10-CM) - Acute left-sided low back pain with left-sided sciatica  M62.838 (ICD-10-CM) - Muscle spasm    RATIONALE FOR EVALUATION AND TREATMENT: Rehabilitation  THERAPY DIAG: Other low back pain  Sciatica, left side  Muscle weakness (generalized)  Other abnormalities of gait and mobility  ONSET DATE: 06/01/2024.  FOLLOW-UP APPT SCHEDULED WITH REFERRING PROVIDER: Didn't address     SUBJECTIVE:                                                                                                                                                                                         SUBJECTIVE STATEMENT:    Patient is a 53 year old male presenting to OPPT with a chief concern of Lower back Pain.   PERTINENT HISTORY:   Vincent Wright is a 53 year old male presenting with lower back pain. Patient reports initial onset occurred while turning and transitioning from bed. Sharp pain was noted and he reported to the ED on  06/01/2024 due to pain intensity. Patient reports that the pain starts in the lower back and radiates into the L buttock and posterior thigh. Pain has minimally improved since initial onset with mitigation from prescription medications.   The patient's pain is aggravated with prolonged walking, standing, sitting. Pain alleviated with heat and pain medications. Patient reports that the pain affects his sleep cycle and he is able to sleep 3-4 hours a night. Patient's pain has limited from performing work related tasks. 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  Dominant hand: right  Red flags: Negative for bowel/bladder changes, saddle paresthesia, direct trauma to lower back   Imaging: Yes   CLINICAL DATA:  Left-sided sciatica.  Low back pain since 06/01/2024   EXAM: LUMBAR SPINE - 2-3 VIEW   COMPARISON:  Reformats from abdominopelvic CT 06/01/2024   FINDINGS: Five non-rib-bearing lumbar vertebra with diminutive ribs at T12. Mild broad-based dextroscoliotic curvature. 6 mm anterolisthesis of L4 on L5. Facet hypertrophy and mild disc space narrowing at this level. No pars defects. Remaining disc spaces are preserved. Vertebral body heights are normal. No evidence of fracture or focal bone abnormality. The iliac joints are normal.   IMPRESSION: 1. Mild broad-based dextroscoliotic curvature. 2. Grade 1 anterolisthesis of L4 on L5 with facet hypertrophy and mild disc space narrowing.     Electronically Signed   By: Andrea Gasman M.D.   On: 06/11/2024 15:17  PAIN:    Pain Intensity: Present: 6/10, Best: 6/10, Worst: 8/10 Pain location: Lower Back and L buttock  Pain Quality: constant, sharp, and burning  Radiating: Yes  Numbness/Tingling: Yes Relieving factors: Prescription Medication History of prior back injury, pain, surgery, or therapy: No  PRECAUTIONS: Fall  WEIGHT BEARING RESTRICTIONS: No  FALLS: Has patient  fallen in last 6 months? No  Living Environment Lives with: lives with their family Lives in: House/apartment Stairs: Yes: External: 5 steps; on left going up Has following equipment at home: Ramped entry  Prior level of function: Independent  Occupational demands: Lifting to 50#   Hobbies: Baseball, [employer], Football Dentist)   PATIENT GOALS: Walk and lift without any pain.    OBJECTIVE:  Patient Surveys  Modified Oswestry:  MODIFIED OSWESTRY DISABILITY SCALE  Date: 07/11/2024 Score  Pain intensity 3 =  Pain medication provides me with moderate relief from pain.  2. Personal care (washing, dressing, etc.) 0 =  I can take care of myself normally without causing increased pain.  3. Lifting 3 = Pain prevents me from lifting heavy weights, but I can manage light to medium weights if they are conveniently positioned  4. Walking 3 =  Pain prevents me from walking more than  mile.  5. Sitting 2 =  Pain prevents me from sitting more than 1 hour.  6. Standing 4 =  Pain prevents me from standing more than 10 minutes.  7. Sleeping 3 =  Even when I take pain medication, I sleep less than 4 hours.  8. Social Life 2 = Pain prevents me from participating in more energetic activities (eg. sports, dancing).  9. Traveling 3 = My pain restricts my travel over 1 hour  10. Employment/ Homemaking 4 = Pain prevents me from doing even light duties.  Total 27/50   Interpretation of scores: Score Category Description  0-20% Minimal Disability The patient can cope with most living activities. Usually no treatment is indicated apart from advice on lifting, sitting and exercise  21-40% Moderate Disability The patient experiences more pain and difficulty with sitting, lifting and standing. Travel and social life are more difficult and they may be disabled from work. Personal care, sexual activity and sleeping are not grossly affected, and the patient can usually be managed by conservative means   41-60% Severe Disability Pain remains the main problem in this group, but activities of daily living are affected. These patients require a detailed investigation  61-80% Crippled Back pain impinges on all aspects of the patient's life. Positive intervention is required  81-100% Bed-bound These patients are either bed-bound  or exaggerating their symptoms  Bluford FORBES Zoe DELENA Karon DELENA, et al. Surgery versus conservative management of stable thoracolumbar fracture: the PRESTO feasibility RCT. [place] ([place]): [employer]; 2021 Nov. [facility] Technology Assessment, No. 25.62.) Appendix 3, Oswestry Disability Index category descriptors. Available from: Findjewelers.cz  Minimally Clinically Important Difference (MCID) = 12.8%  Cognition Patient is oriented to person, place, and time.  Recent memory is intact.    Gross Musculoskeletal Assessment Tremor: None Bulk: Normal Tone: Normal No visible step-off along spinal column, no signs of scoliosis  GAIT: Distance walked: 29m  Assistive device utilized: None Level of assistance: Complete Independence Comments:   Posture: Lumbar lordosis: WNL Iliac crest height: Equal bilaterally Lumbar lateral shift: Negative  AROM AROM (Normal range in degrees) AROM   Lumbar   Flexion (65) 100%  Extension (30) 50%  Right lateral flexion (25) 100%*  Left lateral flexion (25) 100%*  Right rotation (30)   Left rotation (30)       Hip Right Left  Flexion (125)    Extension (15)    Abduction (40)    Adduction     Internal Rotation (45) WNL   External Rotation (45) 30 30      Knee    Flexion (135)    Extension (0)        Ankle    Dorsiflexion (20)    Plantarflexion (50)    Inversion (35)    Eversion (15)    (* = pain; Blank rows = not tested)  LE MMT: MMT (out of 5) Right  Left   Hip flexion 4- 4-  Hip extension    Hip abduction    Hip adduction    Hip internal rotation 4 4  Hip external  rotation 4 4  Knee flexion 4- 4-  Knee extension 4- 4-  Ankle dorsiflexion 5 5  Ankle plantarflexion 5 5  Ankle inversion    Ankle eversion    (* = pain; Blank rows = not tested)  Sensation Grossly intact to light touch throughout bilateral LEs as determined by testing dermatomes L2-S2. Proprioception, stereognosis, and hot/cold testing deferred on this date.  Reflexes R/L Knee Jerk (L3/4): 2+/2+   Muscle Length Hamstrings: R:Negative, 20 deg  L: Positive for tightness and pain, 35 deg   Palpation Location Right Left         Lumbar paraspinals  1  Quadratus Lumborum    Gluteus Maximus  1  Gluteus Medius    Deep hip external rotators  2  PSIS    Fortin's Area (SIJ)    Greater Trochanter    (Blank rows = not tested) Graded on 0-4 scale (0 = no pain, 1 = pain, 2 = pain with wincing/grimacing/flinching, 3 = pain with withdrawal, 4 = unwilling to allow palpation)  Passive Accessory  Motion Pt denies reproduction of back pain with CPA L1-L5 and UPA bilaterally L1-L5. Generally, hypomobile throughout  Special Tests Lumbar Radiculopathy and Discogenic: Centralization and Peripheralization (SN 92, -LR 0.12): Not examined SLR (SN 92, -LR 0.29): R: Negative L:  Positive Crossed SLR (SP 90): Not Examined   Lumbar Foraminal Stenosis: Lumbar quadrant (SN 70): Not Tested   Hip: FABER (SN 81): R: Positive for pain and tightness L: Positive for pain and tightness  TODAY'S TREATMENT: DATE: 08/08/24  Subjective: Patient without any lower back pain. R hip pain centralized to the lower back since last PT appointment.  No questions or concerns.   Therapeutic Exercise:   Supine 90/90  alternating LE with OH reach   3 x 10 - 6#   Supine Hip Flexion marches with Red TB around feet    3 x 10    Supine Dead Bug - 3  x 10    3# AW donned - LE   2 Kg MB    Sidelying Clamshell    R: 2 x 10 - Blue TB around thigh   Side Plank with Clam and Resistance   6 set x 10s hold - Red TB  around thigh (1 min rest break)   3 set x 10s hold - Red TB around thigh    Supine Piriformis Stretch   R: 30s/bout x 2 in order to improve tissue extensibility   Supine Lumbar Rotation Stretch    R: 30s/bout x 2 in order to improve tissue extensibility   Therapeutic Activity:   NuStep L6-2 x 5 min x LE (Seat 16) for LE endurance, strength and torso rotation; PT manually adjusted resistance within patient's tolerance.   Sled Push for Hip extensor and core strengthening 4 x 53m -  50# added   Lateral Stepping against TB    4 x 15' - Blue TB above ankles  4 x 15' - Blue TB above ankles   4 x 15' - Blue TB above ankles   Suitcase Carry  2 x 15' - 20# KB  2 x 15' - 30# KB   2 x 15' - 30# KB   PATIENT EDUCATION:  Education details: Exercise Technique Person educated: Patient Education method: Explanation, Demonstration, and Handouts Education comprehension: verbalized understanding, returned demonstration, and needs further education   HOME EXERCISE PROGRAM:  Access Code: [email or web address] URL: [email or web address] Date: 07/24/2024 Prepared by: Lonni Pall  Exercises - Supine Bridge  - 1 x daily - 3-4 x weekly - 3 sets - 10 reps - Clam with Resistance  - 1 x daily - 7 x weekly - 3 sets - 10 reps - Supine Sciatic Nerve Glide  - 1-2 x daily - 7 x weekly - 2-3 sets - 10 reps - Supine Lower Trunk Rotation  - 1-2 x daily - 7 x weekly - 2-3 sets - 10-15 reps - Supine Piriformis Stretch with Foot on Ground  - 1-2 x daily - 7 x weekly - 3 sets - 10 reps - Hooklying Single Knee to Chest  - 1 x daily - 7 x weekly - 3 sets - 10 reps - Side Plank with Clam and Resistance  - 1 x daily - 3-4 x weekly - 3 sets - 10-15s hold - Supine 90/90 leg extensions  - 1 x daily - 3-4 x weekly - 2-3 sets - 10-12 reps - Side Stepping with Resistance at Ankles  - 1 x daily - 3-4 x weekly - 2-3 sets - 10 reps  Access Code: [email or web address] URL: [email or web address] Date:  07/11/2024 Prepared by: Lonni Pall  Exercises - Supine Bridge  - 1 x daily - 3-4 x weekly - 3 sets - 10 reps - Clam with Resistance  - 1 x daily - 7 x weekly - 3 sets - 10 reps - Supine Sciatic Nerve Glide  - 1-2 x daily - 7 x weekly - 2-3 sets - 10 reps - Supine Lower Trunk Rotation  - 1-2 x daily - 7 x weekly - 2-3 sets - 10-15 reps - Supine Piriformis Stretch with Foot on Ground  - 1-2 x daily - 7 x weekly - 3 sets - 10 reps -  Hooklying Single Knee to Chest  - 1 x daily - 7 x weekly - 3 sets - 10 reps   ASSESSMENT:  CLINICAL IMPRESSION: Continued PT POC in management of LBP and increasing LE strength. Session with main focus on improving gluteal and core strength. Progressed supine core exercise to include dynamic limb movements. Some lower back tightness with side plank and hip abd but no pain endorsed. Patient continues to demonstrate improvements with lower back pain as long as he is adherent to HEP and daily mobility. Patient still has intermittent lower back pain that limits prolonged standing, walking and recreational activities. Patient will still continue to benefit from skilled PT services in order to address current deficits and maximize return to PLOF.    OBJECTIVE IMPAIRMENTS: Abnormal gait, decreased balance, difficulty walking, decreased ROM, decreased strength, and pain.   ACTIVITY LIMITATIONS: carrying, lifting, bending, standing, squatting, stairs, and transfers  PARTICIPATION LIMITATIONS: shopping, community activity, occupation, and yard work  PERSONAL FACTORS: Fitness, Past/current experiences, Profession, Time since onset of injury/illness/exacerbation, and 1 comorbidity: Anterolisthesis of Lumbar Spine  are also affecting patient's functional outcome.   REHAB POTENTIAL: Good  CLINICAL DECISION MAKING: Evolving/moderate complexity  EVALUATION COMPLEXITY: Moderate   GOALS: Goals reviewed with patient? No  SHORT TERM GOALS: Target date: 08/08/2024  Pt  will be independent with HEP in order to improve strength and decrease back pain to improve pain-free function at home and work. Baseline:  07/11/24: Initial provided Goal status: INITIAL   LONG TERM GOALS: Target date: 09/05/2024  Pt will be able to squat and lift 40# with proper squatting mechanics in order to demonstrate clinically significant reduction in back pain and good ability to return to work.  Baseline: 07/11/24: not tested Goal status: INITIAL  2.  Pt will decrease worst back pain by at least 2 points on the NPRS in order to demonstrate clinically significant reduction in back pain. Baseline:  07/11/24: 8/10 NPS  Goal status: INITIAL  3.  Pt will decrease mODI score by at least 13 points in order demonstrate clinically significant reduction in back pain/disability.       Baseline: 07/11/2024: 27/50 = 54% (moderate disability)  Goal status: INITIAL  4.  Pt will achieve 6-8 hours of sleep per night without report of awakening from pain in order to demonstrate significant improvements in pain. Baseline: 07/11/2024: 3-4 hours Goal status: INITIAL  4.  Pt will increase by at least 0.13 m/s in order to demonstrate clinically significant improvement in community ambulation.  Baseline: 07/11/2024: .74 (Self Selected) without AD Goal status: INITIAL  PLAN:  PT FREQUENCY: 1-2x/week  PT DURATION: 8 weeks  PLANNED INTERVENTIONS: 97110-Therapeutic exercises, 97530- Therapeutic activity, 97112- Neuromuscular re-education, 97535- Self Care, [id number]- Manual therapy, [phone]- Gait training, Patient/Family education, Balance training, Joint manipulation, Spinal mobilization, Cryotherapy, and Moist heat  PLAN FOR NEXT SESSION: Review HEP, Progress Gluteal Strengthening, Flexion based core exercises.   Lonni Pall PT, DPT Physical Therapist- [place]  08/08/2024, 5:35 PM

## 2024-08-13 DIAGNOSIS — M5417 Radiculopathy, lumbosacral region: Secondary | ICD-10-CM | POA: Insufficient documentation

## 2024-08-14 ENCOUNTER — Telehealth: Payer: Self-pay

## 2024-08-14 ENCOUNTER — Encounter: Payer: Self-pay | Admitting: Family Medicine

## 2024-08-14 ENCOUNTER — Ambulatory Visit

## 2024-08-14 NOTE — Telephone Encounter (Signed)
 Addressed via MyChart

## 2024-08-14 NOTE — Telephone Encounter (Signed)
 Copied from CRM [id number]. Topic: General - Other >> Aug 14, 2024 11:43 AM Carlyon D wrote: Reason for CRM: Pt states he is trying to return to work and states he had a form for the pcp to fill out and he needs something that states he can return to work with no restrictions. Pt states he went to work this morning and they sent him home. Please reach out to pt [phone].

## 2024-08-16 ENCOUNTER — Ambulatory Visit

## 2024-08-20 ENCOUNTER — Ambulatory Visit

## 2024-08-22 ENCOUNTER — Ambulatory Visit

## 2024-08-27 ENCOUNTER — Ambulatory Visit

## 2024-08-29 ENCOUNTER — Ambulatory Visit

## 2024-09-03 ENCOUNTER — Ambulatory Visit

## 2024-09-27 ENCOUNTER — Ambulatory Visit: Payer: Self-pay | Admitting: Podiatry

## 2024-09-27 DIAGNOSIS — Z91199 Patient's noncompliance with other medical treatment and regimen due to unspecified reason: Secondary | ICD-10-CM

## 2024-10-01 NOTE — Progress Notes (Signed)
Patient did not show for scheduled appointment today.
